# Patient Record
Sex: Female | Born: 1960 | Race: White | Hispanic: No
Health system: Midwestern US, Academic
[De-identification: ages and names within clinical notes are randomized; demographics above are authoritative.]

---

## 2018-05-27 ENCOUNTER — Encounter: Admit: 2018-05-27 | Discharge: 2018-05-27 | Payer: Commercial Managed Care - PPO

## 2018-06-27 ENCOUNTER — Encounter: Admit: 2018-06-27 | Discharge: 2018-06-27 | Payer: Commercial Managed Care - PPO

## 2018-06-28 ENCOUNTER — Encounter: Admit: 2018-06-28 | Discharge: 2018-06-28 | Payer: Commercial Managed Care - PPO

## 2018-07-19 ENCOUNTER — Encounter: Admit: 2018-07-19 | Discharge: 2018-07-19 | Payer: Commercial Managed Care - PPO

## 2018-08-18 ENCOUNTER — Encounter: Admit: 2018-08-18 | Discharge: 2018-08-18 | Payer: Commercial Managed Care - PPO

## 2018-10-13 ENCOUNTER — Encounter: Admit: 2018-10-13 | Discharge: 2018-10-13 | Payer: Commercial Managed Care - PPO

## 2018-10-18 ENCOUNTER — Encounter: Admit: 2018-10-18 | Discharge: 2018-10-18 | Payer: Commercial Managed Care - PPO

## 2018-10-26 ENCOUNTER — Encounter: Admit: 2018-10-26 | Discharge: 2018-10-26 | Payer: Commercial Managed Care - PPO

## 2019-01-20 ENCOUNTER — Encounter: Admit: 2019-01-20 | Discharge: 2019-01-20

## 2019-11-14 ENCOUNTER — Encounter: Admit: 2019-11-14 | Discharge: 2019-11-14 | Payer: MEDICAID

## 2019-11-15 ENCOUNTER — Encounter: Admit: 2019-11-15 | Discharge: 2019-11-15 | Payer: MEDICAID

## 2019-11-15 ENCOUNTER — Ambulatory Visit: Admit: 2019-11-15 | Discharge: 2019-11-15 | Payer: MEDICAID

## 2019-11-15 DIAGNOSIS — R42 Dizziness and giddiness: Secondary | ICD-10-CM

## 2019-11-15 DIAGNOSIS — M9902 Segmental and somatic dysfunction of thoracic region: Secondary | ICD-10-CM

## 2019-11-15 DIAGNOSIS — Z8679 Personal history of other diseases of the circulatory system: Secondary | ICD-10-CM

## 2019-11-15 DIAGNOSIS — D539 Nutritional anemia, unspecified: Secondary | ICD-10-CM

## 2019-11-15 DIAGNOSIS — M5416 Radiculopathy, lumbar region: Secondary | ICD-10-CM

## 2019-11-15 DIAGNOSIS — F419 Anxiety disorder, unspecified: Secondary | ICD-10-CM

## 2019-11-15 DIAGNOSIS — B1920 Unspecified viral hepatitis C without hepatic coma: Secondary | ICD-10-CM

## 2019-11-15 DIAGNOSIS — K769 Liver disease, unspecified: Secondary | ICD-10-CM

## 2019-11-15 MED ORDER — TRIAMCINOLONE ACETONIDE 40 MG/ML IJ SUSP
80 mg | Freq: Once | EPIDURAL | 0 refills | Status: CP
Start: 2019-11-15 — End: ?
  Administered 2019-11-15: 18:00:00 80 mg via EPIDURAL

## 2019-11-15 MED ORDER — IOPAMIDOL 41 % IT SOLN
2.5 mL | Freq: Once | EPIDURAL | 0 refills | Status: CP
Start: 2019-11-15 — End: ?
  Administered 2019-11-15: 18:00:00 2.5 mL via EPIDURAL

## 2019-11-15 NOTE — Procedures
Attending Surgeon: Lizbeth Bark, MD    Anesthesia: Local    Pre-Procedure Diagnosis:   1. Lumbar radiculopathy        Post-Procedure Diagnosis:   1. Lumbar radiculopathy        [facility] INJECT SNRB/TFESI LUMBAR/SACRAL  Procedure: transforaminal epidural    Laterality: right    Location: Lumbar/Sacral - L5-S1 and L4-5      Consent:   Consent obtained: verbal and written  Consent given by: patient  Risks discussed: bleeding, bruising, infection, nerve damage, no change or worsening in pain, allergic reaction and reaction to medication  Alternatives discussed: alternative treatment  Discussed with patient the purpose of the treatment/procedure, other ways of treating my condition, including no treatment/ procedure and the risks and benefits of the alternatives. Patient has decided to proceed with treatment/procedure.        Universal Protocol:  Relevant documents: relevant documents present and verified  Test results: test results available and properly labeled  Imaging studies: imaging studies available  Required items: required blood products, implants, devices, and special equipment not available  Site marked: the operative site was marked  Patient identity confirmed: Patient identify confirmed verbally with patient.        Time out: Immediately prior to procedure a time out was called to verify the correct patient, procedure, equipment, support staff and site/side marked as required      Procedures Details:   Indications: pain   Prep: chlorhexidine  Number of Joints: 2  Approach: paramedian  Guidance: fluoroscopy  Contrast: Procedure confirmed with contrast under live fluoroscopy.  Needle and Epidural Catheter: quincke  Needle size: 25 G  Patient tolerance: Patient tolerated the procedure well with no immediate complications. Pressure was applied, and hemostasis was accomplished.  Outcome: Pain improved  Comments: Indications:Patient presents with a diagnosis of radiculopathy. The patient's history and physical exam were reviewed. The risks, benefits and alternatives to the procedure were discussed, and all questions were answered to the patient's satisfaction. The patient agreed to proceed, and written informed consent was obtained.     Procedure in Detail: IV was started? No    The patient was brought into the procedure room and placed in the prone position on the fluoroscopy table. Standard monitors were placed, and vital signs were observed throughout the procedure. The area of the lumbar spine was prepped with Chloroprep and draped in a sterile manner. ?    The right L4-L5 vertebral bodies were identified with AP fluoroscopy. An oblique view to the right was obtained to better visualize the inferior junction of the pedicle and transverse process. The 6 o'clock position of the pedicle was marked and identified. The skin and subcutaneous tissues in the area were anesthetized with 1% lidocaine. A 25-gauge, 3.5 inch needle was directed toward the targeted point under fluoroscopy until bone was contacted. The needle was then walked inferiorly until the neural foramen was entered. A lateral fluoroscopic view was then used to place the needle tip at the 10 o'clock position of the foramen.     The right L5-S1 vertebral bodies were identified with AP fluoroscopy. An oblique view to the right was obtained to better visualize the inferior junction of the pedicle and transverse process. The 6 o'clock position of the pedicle was marked and identified. The skin and subcutaneous tissues in the area were anesthetized with 1% lidocaine. A 25-gauge, 3.5 inch needle was directed toward the targeted point under fluoroscopy until bone was contacted. The needle was then  walked inferiorly until the neural foramen was entered. A lateral fluoroscopic view was then used to place the needle tip at the 10 o'clock position of the foramen.     Negative aspiration was confirmed, and 1ml of contrast was injected at each level. Appropriate neurograms were observed under live AP fluoroscopy with no noted vascular or intrathecal uptake. Then, after negative aspiration, a solution consisting of 1 mL 1% lidocaine and ?80 (steroid) mg triamcinolone with 1.5 mL of solution easily injected at each level. The needles were removed with a 1% lidocaine flush. The patient's back was cleaned and a bandage was placed over the needle insertion points.    The same procedure was performed on the opposite side? No    Disposition: The patient tolerated the procedure well, and there were no apparent complications. Vital signs remained stable througtout the procedure. The patient was taken to the recovery area where discharge instructions for the procedure were given.     Estimated Blood Loss: minimal    Specimens: none    Complications: none          Estimated blood loss: none or minimal  Specimens: none  Patient tolerated the procedure well with no immediate complications. Pressure was applied, and hemostasis was accomplished.

## 2019-11-15 NOTE — Progress Notes

## 2019-11-15 NOTE — Patient Instructions
Procedure Completed Today: Lumbar Transforaminal Steroid Injection    Important information following your procedure today: You may drive today    1. Pain relief may not be immediate. It is possible you may even experience an increase in pain during the first 24-48 hours followed by a gradual decrease of your pain.  2. Though the procedure is generally safe and complications are rare, we do ask that you be aware of any of the following:   ? Any swelling, persistent redness, new bleeding, or drainage from the site of the injection.  ? You should not experience a severe headache.  ? You should not run a fever over 101? F.  ? New onset of sharp, severe back & or neck pain.  ? New onset of upper or lower extremity numbness or weakness.  ? New difficulty controlling bowel or bladder function after the injection.  ? New shortness of breath.    If any of these occur, please call to report this occurrence to a nurse at [phone]. If you are calling after 4:00 p.m., on weekends or holidays please call [phone] and ask to have the resident physician on call for the physician paged or go to your local emergency room.  3. You may experience soreness at the injection site. Ice can be applied at 20 minute intervals. Avoid application of direct heat, hot showers or hot tubs today.  4. Avoid strenuous activity today. You may resume your regular activities and exercise tomorrow.  5. Patients with diabetes may see an elevation in blood sugars for 7-10 days after the injection. It is important to pay close attention to your diet, check your blood sugars daily and report extreme elevations to the physician that treats your diabetes.  6. Patients taking a daily blood thinner can resume their regular dose this evening.  7. It is important that you take all medications ordered by your pain physician. Taking medication as ordered is an important part of your pain care plan. If you cannot continue the medication plan, please notify the physician.     Possible side effects to steroids that may occur:  ? Flushing or redness of the face  ? Irritability  ? Fluid retention  ? Change in women?s menses    The following medications were used: Lidocaine , Triamcinolone   and Contrast Dye

## 2019-11-15 NOTE — Progress Notes
[facility]  INTERVENTIONAL PAIN PROCEDURE HISTORY AND PHYSICAL    Chief Complaint   Patient presents with   ? Lower Back - Pain       HISTORY OF PRESENT ILLNESS:  Erin Sanchez is a 59 y.o. year old female who presents for injection.  Denies fevers, chills, or recent hospitalizations.  Patient denies currently taking blood thinning medications.        Medical History:   Diagnosis Date   ? Anxiety disorder    ? Dizziness    ? Hearing loss    ? Hepatitis C    ? History of hypertension    ? Liver disease     HEP C -SPLEEN   ? Somatic dysfunction of thoracic region    ? Unspecified deficiency anemia        Surgical History:   Procedure Laterality Date   ? HX ENDOSCOPY  2012   ? COLONOSCOPY  2012   ? ESOPHAGUS SURGERY         family history includes Cancer in her mother; Heart Attack in her mother; Other in her father; Stroke in her father.    Social History     Socioeconomic History   ? Marital status: Widowed     Spouse name: Not on file   ? Number of children: Not on file   ? Years of education: Not on file   ? Highest education level: Not on file   Occupational History     Employer: [employer]   Tobacco Use   ? Smoking status: Former Smoker     Types: Cigarettes     Quit date: 03/09/1998     Years since quitting: 21.7   ? Smokeless tobacco: Never Used   Substance and Sexual Activity   ? Alcohol use: Yes     Alcohol/week: 2.5 standard drinks     Types: 3 drink(s) per week     Comment: stopped Aug 2012   ? Drug use: No   ? Sexual activity: Not on file   Other Topics Concern   ? Not on file   Social History Narrative   ? Not on file       Allergies   Allergen Reactions   ? Codeine ITCHING   ? Ciprofloxacin UNKNOWN     Used for an eye infection, caused patient to be very ill.       Vitals:    11/15/19 1234   BP: (!) 138/95   BP Source: Arm, Right Upper   Patient Position: Sitting   Pulse: 108   Resp: 18   Temp: 36.4 ?C (97.5 ?F)   TempSrc: Oral   SpO2: 99%   Weight: 90.7 kg (200 lb)   Height: 167.6 cm (66) PainSc: Eight       REVIEW OF SYSTEMS: 10 point ROS obtained and negative except back pain      PHYSICAL EXAM:  General: 59 y.o. female appears stated age, in no acute distress  HEENT: Normocephalic, atraumatic  Neck: No thyroidmegaly  Cardiovascular: Well perfused  Pulmonary: Unlabored respirations  Extremities: No cyanosis, clubbing, or edema  Skin: No lesions seen on exposed skin  Psychiatric:  Appropriate mood and affect  Musculoskeletal: No atrophy.   Neurologic: Antigravity strength in all extremities. CN II -XII grossly intact.  Alert and oriented x 3.           IMPRESSION:    1. Lumbar radiculopathy         PLAN: Lumbar  Transforaminal Steroid Injection Right L4-5, L5-S1

## 2020-01-09 ENCOUNTER — Ambulatory Visit: Admit: 2020-01-09 | Discharge: 2020-01-09 | Payer: MEDICAID

## 2020-01-09 ENCOUNTER — Encounter: Admit: 2020-01-09 | Discharge: 2020-01-09 | Payer: MEDICAID

## 2020-01-09 DIAGNOSIS — M9902 Segmental and somatic dysfunction of thoracic region: Secondary | ICD-10-CM

## 2020-01-09 DIAGNOSIS — K769 Liver disease, unspecified: Secondary | ICD-10-CM

## 2020-01-09 DIAGNOSIS — F419 Anxiety disorder, unspecified: Secondary | ICD-10-CM

## 2020-01-09 DIAGNOSIS — R42 Dizziness and giddiness: Secondary | ICD-10-CM

## 2020-01-09 DIAGNOSIS — Z8679 Personal history of other diseases of the circulatory system: Secondary | ICD-10-CM

## 2020-01-09 DIAGNOSIS — M5137 Other intervertebral disc degeneration, lumbosacral region: Secondary | ICD-10-CM

## 2020-01-09 DIAGNOSIS — M5416 Radiculopathy, lumbar region: Secondary | ICD-10-CM

## 2020-01-09 DIAGNOSIS — D539 Nutritional anemia, unspecified: Secondary | ICD-10-CM

## 2020-01-09 DIAGNOSIS — B1920 Unspecified viral hepatitis C without hepatic coma: Secondary | ICD-10-CM

## 2020-01-09 MED ORDER — HYDROCODONE-ACETAMINOPHEN 5-325 MG PO TAB
1 | ORAL_TABLET | ORAL | 0 refills | 30.00000 days | Status: AC | PRN
Start: 2020-01-09 — End: ?

## 2020-01-09 NOTE — Progress Notes
[facility] CLINIC NOTE       SUBJECTIVE: 59 year old female presents in follow-up from last visit on 11/15/2019.  At that time we did a right-sided L4-5, transforaminal epidural steroid injection.  She report about 80% relief for for about 3 or 4 weeks until she bent over and she felt pain in her back and all the pain returned.  She reports the pain is midline low back radiating mostly down right leg to just past her right knee.  Seems to be a bit different than initial presentation pain is no longer going down into her foot or ankle.  She is continuing exercises but she is having difficulty doing activities of daily living including cleaning her kitchen, standing at the sink to do dishes, cooking.  She needs to sit down and rest frequently.         Review of Systems    Current Outpatient Medications:   ?  acetaminophen (TYLENOL) 500 mg tablet, Take 500 mg by mouth twice daily. Max of 4,000 mg of acetaminophen in 24 hours., Disp: , Rfl:   ?  ALPRAZolam (XANAX) 0.5 mg tablet, Take 1 mg by mouth at bedtime as needed., Disp: , Rfl:   ?  chlorthalidone (HYGROTON) 50 mg tablet, Take 50 mg by mouth., Disp: , Rfl:   ?  cyclobenzaprine (FLEXERIL) 5 mg tablet, Take 5 mg by mouth three times daily as needed for Muscle Cramps., Disp: , Rfl:   ?  dicyclomine (BENTYL) 10 mg capsule, Take 10 mg by mouth four times daily., Disp: , Rfl:   ?  gabapentin (NEURONTIN) 300 mg capsule, Take 300 mg by mouth every 8 hours., Disp: , Rfl:   ?  HYDROcodone/acetaminophen (NORCO) 5/325 mg tablet, Take one tablet by mouth every 6 hours as needed for Pain, Disp: 60 tablet, Rfl: 0  ?  ibuprofen (MOTRIN) 800 mg tablet, Take 800 mg by mouth twice daily. Take with food., Disp: , Rfl:   ?  meloxicam (MOBIC) 15 mg tablet, Take 15 mg by mouth daily., Disp: , Rfl:   ?  omeprazole DR (PRILOSEC) 40 mg capsule, Take 40 mg by mouth., Disp: , Rfl:   ?  ondansetron (ZOFRAN) 4 mg tablet, Take 4 mg by mouth every 8 hours as needed., Disp: , Rfl:   ?  other medication, 1 Dose daily. Pt. Believes it is called Zetima, an anti-depressant. Dose unknown, Disp: , Rfl:   ?  potassium chloride SR (K-DUR) 20 mEq tablet, Take 20 mEq by mouth., Disp: , Rfl:   ?  PV W-O CAL/FERROUS FUMARATE/FA (M-VIT PO), Take 1 Tab by mouth daily., Disp: , Rfl:   ?  triazolam (HALCION) 0.25 mg tablet, Take 0.25 mg by mouth twice daily., Disp: , Rfl:   Allergies   Allergen Reactions   ? Codeine ITCHING   ? Ciprofloxacin UNKNOWN     Used for an eye infection, caused patient to be very ill.     Physical Exam  Vitals:    01/09/20 1327   BP: (!) 159/94   BP Source: Arm, Left Upper   Patient Position: Sitting   Pulse: 108   SpO2: 97%   PainSc: Nine        Pain Score: Nine  There is no height or weight on file to calculate BMI.           IMPRESSION:  1. Lumbar radiculopathy    2. DDD (degenerative disc disease), lumbosacral  PLAN:    Scheduling a right-sided L2-3, 3 4 transforaminal epidural steroid injection as her pain is more in those distributions and that seems to be where the worst of her degenerative disc disease seen on her CT scan.  We will see how she does with this injection, if she does not get longer lasting relief we will consider surgical referral.  Will prescribe Norco 5/325 to help her with pain control until she gets to the injection.

## 2020-01-30 ENCOUNTER — Encounter: Admit: 2020-01-30 | Discharge: 2020-01-30 | Payer: MEDICAID

## 2020-01-30 DIAGNOSIS — M5416 Radiculopathy, lumbar region: Secondary | ICD-10-CM

## 2020-01-31 ENCOUNTER — Ambulatory Visit: Admit: 2020-01-31 | Discharge: 2020-01-31 | Payer: MEDICAID

## 2020-01-31 ENCOUNTER — Encounter: Admit: 2020-01-31 | Discharge: 2020-01-31 | Payer: MEDICAID

## 2020-01-31 DIAGNOSIS — M5416 Radiculopathy, lumbar region: Secondary | ICD-10-CM

## 2020-01-31 DIAGNOSIS — M5137 Other intervertebral disc degeneration, lumbosacral region: Secondary | ICD-10-CM

## 2020-01-31 MED ORDER — TRIAMCINOLONE ACETONIDE 40 MG/ML IJ SUSP
80 mg | Freq: Once | EPIDURAL | 0 refills | Status: CP
Start: 2020-01-31 — End: ?
  Administered 2020-01-31: 18:00:00 80 mg via EPIDURAL

## 2020-01-31 MED ORDER — IOPAMIDOL 41 % IT SOLN
2.5 mL | Freq: Once | EPIDURAL | 0 refills | Status: CP
Start: 2020-01-31 — End: ?
  Administered 2020-01-31: 18:00:00 2.5 mL via EPIDURAL

## 2020-01-31 NOTE — Procedures
Attending Surgeon: Lizbeth Bark, MD    Anesthesia: Local    Pre-Procedure Diagnosis:   1. Lumbar radiculopathy    2. DDD (degenerative disc disease), lumbosacral        Post-Procedure Diagnosis:   1. Lumbar radiculopathy    2. DDD (degenerative disc disease), lumbosacral        [facility] INJECT SNRB/TFESI LUMBAR/SACRAL  Procedure: transforaminal epidural    Laterality: right    Location: Lumbar/Sacral - L2-3 and L3-4      Consent:   Consent obtained: verbal and written  Consent given by: patient  Risks discussed: bleeding, bruising, infection, nerve damage, no change or worsening in pain, allergic reaction and reaction to medication  Alternatives discussed: alternative treatment  Discussed with patient the purpose of the treatment/procedure, other ways of treating my condition, including no treatment/ procedure and the risks and benefits of the alternatives. Patient has decided to proceed with treatment/procedure.        Universal Protocol:  Relevant documents: relevant documents present and verified  Test results: test results available and properly labeled  Imaging studies: imaging studies available  Required items: required blood products, implants, devices, and special equipment not available  Site marked: the operative site was marked  Patient identity confirmed: Patient identify confirmed verbally with patient.        Time out: Immediately prior to procedure a time out was called to verify the correct patient, procedure, equipment, support staff and site/side marked as required      Procedures Details:   Indications: pain   Prep: chlorhexidine  Number of Joints: 2  Approach: paramedian  Guidance: fluoroscopy  Contrast: Procedure confirmed with contrast under live fluoroscopy.  Needle and Epidural Catheter: quincke  Needle size: 25 G  Patient tolerance: Patient tolerated the procedure well with no immediate complications. Pressure was applied, and hemostasis was accomplished.  Outcome: Pain improved Comments: Indications:Patient presents with a diagnosis of radiculopathy. The patient's history and physical exam were reviewed. The risks, benefits and alternatives to the procedure were discussed, and all questions were answered to the patient's satisfaction. The patient agreed to proceed, and written informed consent was obtained.     Procedure in Detail: IV was started? No    The patient was brought into the procedure room and placed in the prone position on the fluoroscopy table. Standard monitors were placed, and vital signs were observed throughout the procedure. The area of the lumbar spine was prepped with Chloroprep and draped in a sterile manner. ?    The right L2-L3  vertebral bodies were identified with AP fluoroscopy. An oblique view to the right was obtained to better visualize the inferior junction of the pedicle and transverse process. The 6 o'clock position of the pedicle was marked and identified. The skin and subcutaneous tissues in the area were anesthetized with 1% lidocaine. A 25-gauge, 3.5 inch needle was directed toward the targeted point under fluoroscopy until bone was contacted. The needle was then walked inferiorly until the neural foramen was entered. A lateral fluoroscopic view was then used to place the needle tip at the 10 o'clock position of the foramen.     The L3-L4 vertebral bodies were identified with AP fluoroscopy. An oblique view to the right was obtained to better visualize the inferior junction of the pedicle and transverse process. The 6 o'clock position of the pedicle was marked and identified. The skin and subcutaneous tissues in the area were anesthetized with 1% lidocaine. A 25-gauge, 3.5 inch  needle was directed toward the targeted point under fluoroscopy until bone was contacted. The needle was then walked inferiorly until the neural foramen was entered. A lateral fluoroscopic view was then used to place the needle tip at the 10 o'clock position of the foramen. Negative aspiration was confirmed, and 1ml of contrast was injected at each level. Appropriate neurograms were observed under live AP fluoroscopy with no noted vascular or intrathecal uptake. Then, after negative aspiration, a solution consisting of 1 mL 1% lidocaine and ?80 (steroid) mg triamcinolone with 1.5 mL of solution easily injected at each level. The needles were removed with a 1% lidocaine flush. The patient's back was cleaned and a bandage was placed over the needle insertion points.    The same procedure was performed on the opposite side? No    Disposition: The patient tolerated the procedure well, and there were no apparent complications. Vital signs remained stable througtout the procedure. The patient was taken to the recovery area where discharge instructions for the procedure were given.     Estimated Blood Loss: minimal    Specimens: none    Complications: none          Estimated blood loss: none or minimal  Specimens: none  Patient tolerated the procedure well with no immediate complications. Pressure was applied, and hemostasis was accomplished.

## 2020-01-31 NOTE — Progress Notes
[facility]  INTERVENTIONAL PAIN PROCEDURE HISTORY AND PHYSICAL    Chief Complaint   Patient presents with   ? Lower Back - Pain       HISTORY OF PRESENT ILLNESS:  Erin Sanchez is a 59 y.o. year old female who presents for injection.  Denies fevers, chills, or recent hospitalizations.  Patient denies currently taking blood thinning medications.        Medical History:   Diagnosis Date   ? Anxiety disorder    ? Dizziness    ? Hearing loss    ? Hepatitis C    ? History of hypertension    ? Liver disease     HEP C -SPLEEN   ? Somatic dysfunction of thoracic region    ? Unspecified deficiency anemia        Surgical History:   Procedure Laterality Date   ? HX ENDOSCOPY  2012   ? COLONOSCOPY  2012   ? ESOPHAGUS SURGERY         family history includes Cancer in her mother; Heart Attack in her mother; Other in her father; Stroke in her father.    Social History     Socioeconomic History   ? Marital status: Widowed     Spouse name: Not on file   ? Number of children: Not on file   ? Years of education: Not on file   ? Highest education level: Not on file   Occupational History     Employer: [employer]   Tobacco Use   ? Smoking status: Former Smoker     Types: Cigarettes     Quit date: 03/09/1998     Years since quitting: 21.9   ? Smokeless tobacco: Never Used   Substance and Sexual Activity   ? Alcohol use: Yes     Alcohol/week: 2.5 standard drinks     Types: 3 drink(s) per week     Comment: stopped Aug 2012   ? Drug use: No   ? Sexual activity: Not on file   Other Topics Concern   ? Not on file   Social History Narrative   ? Not on file       Allergies   Allergen Reactions   ? Codeine ITCHING   ? Ciprofloxacin UNKNOWN     Used for an eye infection, caused patient to be very ill.       Vitals:    01/31/20 1219   BP: (!) 148/89   BP Source: Arm, Right Upper   Patient Position: Sitting   Pulse: 114   Resp: 16   Temp: 36.4 ?C (97.5 ?F)   TempSrc: Oral   SpO2: 97%   Weight: 90.7 kg (200 lb)   Height: 167.6 cm (66) PainSc: Five       REVIEW OF SYSTEMS: 10 point ROS obtained and negative except back pain      PHYSICAL EXAM:  General: 59 y.o. female appears stated age, in no acute distress  HEENT: Normocephalic, atraumatic  Neck: No thyroidmegaly  Cardiovascular: Well perfused  Pulmonary: Unlabored respirations  Extremities: No cyanosis, clubbing, or edema  Skin: No lesions seen on exposed skin  Psychiatric:  Appropriate mood and affect  Musculoskeletal: No atrophy.   Neurologic: Antigravity strength in all extremities. CN II -XII grossly intact.  Alert and oriented x 3.           IMPRESSION:    1. Lumbar radiculopathy    2. DDD (degenerative disc disease), lumbosacral  PLAN: Lumbar Transforaminal Steroid Injection Right L2-L3, L3-L4

## 2020-01-31 NOTE — Patient Instructions
Procedure Completed Today: Lumbar Transforaminal Steroid Injection    Important information following your procedure today: You may drive today    1. Pain relief may not be immediate. It is possible you may even experience an increase in pain during the first 24-48 hours followed by a gradual decrease of your pain.  2. Though the procedure is generally safe and complications are rare, we do ask that you be aware of any of the following:   ? Any swelling, persistent redness, new bleeding, or drainage from the site of the injection.  ? You should not experience a severe headache.  ? You should not run a fever over 101? F.  ? New onset of sharp, severe back & or neck pain.  ? New onset of upper or lower extremity numbness or weakness.  ? New difficulty controlling bowel or bladder function after the injection.  ? New shortness of breath.    If any of these occur, please call to report this occurrence to a nurse at [phone]. If you are calling after 4:00 p.m., on weekends or holidays please call [phone] and ask to have the resident physician on call for the physician paged or go to your local emergency room.  3. You may experience soreness at the injection site. Ice can be applied at 20 minute intervals. Avoid application of direct heat, hot showers or hot tubs today.  4. Avoid strenuous activity today. You may resume your regular activities and exercise tomorrow.  5. Patients with diabetes may see an elevation in blood sugars for 7-10 days after the injection. It is important to pay close attention to your diet, check your blood sugars daily and report extreme elevations to the physician that treats your diabetes.  6. Patients taking a daily blood thinner can resume their regular dose this evening.  7. It is important that you take all medications ordered by your pain physician. Taking medication as ordered is an important part of your pain care plan. If you cannot continue the medication plan, please notify the physician.     Possible side effects to steroids that may occur:  ? Flushing or redness of the face  ? Irritability  ? Fluid retention  ? Change in women?s menses    The following medications were used: Lidocaine , Triamcinolone   and Contrast Dye

## 2020-01-31 NOTE — Progress Notes

## 2020-02-11 ENCOUNTER — Encounter: Admit: 2020-02-11 | Discharge: 2020-02-11 | Payer: MEDICAID

## 2020-02-12 ENCOUNTER — Encounter: Admit: 2020-02-12 | Discharge: 2020-02-12 | Payer: MEDICAID

## 2020-02-12 ENCOUNTER — Ambulatory Visit: Admit: 2020-02-12 | Discharge: 2020-02-12 | Payer: MEDICAID

## 2020-02-12 DIAGNOSIS — K449 Diaphragmatic hernia without obstruction or gangrene: Secondary | ICD-10-CM

## 2020-02-12 DIAGNOSIS — K279 Peptic ulcer, site unspecified, unspecified as acute or chronic, without hemorrhage or perforation: Secondary | ICD-10-CM

## 2020-02-12 DIAGNOSIS — F419 Anxiety disorder, unspecified: Secondary | ICD-10-CM

## 2020-02-12 DIAGNOSIS — D539 Nutritional anemia, unspecified: Secondary | ICD-10-CM

## 2020-02-12 DIAGNOSIS — K7460 Unspecified cirrhosis of liver: Secondary | ICD-10-CM

## 2020-02-12 DIAGNOSIS — Z1211 Encounter for screening for malignant neoplasm of colon: Secondary | ICD-10-CM

## 2020-02-12 DIAGNOSIS — D509 Iron deficiency anemia, unspecified: Secondary | ICD-10-CM

## 2020-02-12 DIAGNOSIS — Z20822 Encounter for screening laboratory testing for COVID-19 virus in asymptomatic patient: Secondary | ICD-10-CM

## 2020-02-12 DIAGNOSIS — K769 Liver disease, unspecified: Secondary | ICD-10-CM

## 2020-02-12 DIAGNOSIS — Z8679 Personal history of other diseases of the circulatory system: Secondary | ICD-10-CM

## 2020-02-12 DIAGNOSIS — R42 Dizziness and giddiness: Secondary | ICD-10-CM

## 2020-02-12 DIAGNOSIS — B1920 Unspecified viral hepatitis C without hepatic coma: Secondary | ICD-10-CM

## 2020-02-12 DIAGNOSIS — M9902 Segmental and somatic dysfunction of thoracic region: Secondary | ICD-10-CM

## 2020-02-12 LAB — CBC AND DIFF
Lab: 0.1 10*3/uL (ref 0–0.20)
Lab: 0.1 10*3/uL (ref 0–0.45)
Lab: 0.6 10*3/uL (ref 0–0.80)
Lab: 1 % (ref 0–2)
Lab: 1 % (ref 0–5)
Lab: 11 K/UL — ABNORMAL HIGH (ref 4.5–11.0)
Lab: 18 % — ABNORMAL HIGH (ref 11–15)
Lab: 2.4 10*3/uL (ref 1.0–4.8)
Lab: 21 % — ABNORMAL LOW (ref 24–44)
Lab: 281 10*3/uL (ref 150–400)
Lab: 5 % (ref 4–12)
Lab: 5 M/UL — ABNORMAL HIGH (ref 4.0–5.0)
Lab: 72 % (ref 41–77)
Lab: 8.3 10*3/uL — ABNORMAL HIGH (ref 1.8–7.0)
Lab: 8.8 FL (ref 7–11)

## 2020-02-12 LAB — IRON + BINDING CAPACITY + %SAT+ FERRITIN
Lab: 39 ug/dL — ABNORMAL LOW (ref 50–160)
Lab: 697 ug/dL — ABNORMAL HIGH (ref 270–380)
Lab: 9 ng/mL — ABNORMAL LOW (ref 10–200)

## 2020-02-12 LAB — HEPATITIS C ANTIBODY W REFLEX HCV PCR QUANT

## 2020-02-12 LAB — PROTIME INR (PT): Lab: 1 % — ABNORMAL LOW (ref 0.8–1.2)

## 2020-02-12 MED ORDER — OMEPRAZOLE 40 MG PO CPDR
40 mg | ORAL_CAPSULE | Freq: Two times a day (BID) | ORAL | 1 refills | Status: AC
Start: 2020-02-12 — End: ?

## 2020-02-12 MED ORDER — PEG-ELECTROLYTE SOLN 420 GRAM PO SOLR
0 refills | Status: AC
Start: 2020-02-12 — End: ?

## 2020-02-12 NOTE — Telephone Encounter
Referral received via internal. Docs in 02.

## 2020-02-12 NOTE — Telephone Encounter
I returned patient's phone call.  Patient had called requesting for instructions prior to endoscopy scheduled with Dr. Allena Katz on 02/12/2020.    Patient states that she is unaware of the type of endoscopic work-up she is scheduled for.  She states that she previously followed with [facility] and has been waiting for more than 12 months to undergo endoscopy and reports a prior history of hiatal hernia.    I requested patient to undergo rapid testing for COVID-19 as a preprocedural measurement.  I requested the patient to take copy of results if negative to present at the time of endoscopy.    Patient verbalized understanding and agreed to the plan.    This is a late entry.    Tytan Sandate  GI fellow   02/11/2020 8:03 PM

## 2020-02-13 ENCOUNTER — Encounter: Admit: 2020-02-13 | Discharge: 2020-02-13 | Payer: MEDICAID

## 2020-02-13 ENCOUNTER — Ambulatory Visit: Admit: 2020-02-13 | Discharge: 2020-02-13 | Payer: MEDICAID

## 2020-02-13 DIAGNOSIS — K7460 Unspecified cirrhosis of liver: Secondary | ICD-10-CM

## 2020-02-13 DIAGNOSIS — Z1211 Encounter for screening for malignant neoplasm of colon: Secondary | ICD-10-CM

## 2020-02-13 DIAGNOSIS — D509 Iron deficiency anemia, unspecified: Secondary | ICD-10-CM

## 2020-02-13 DIAGNOSIS — K449 Diaphragmatic hernia without obstruction or gangrene: Secondary | ICD-10-CM

## 2020-02-14 ENCOUNTER — Encounter: Admit: 2020-02-14 | Discharge: 2020-02-14 | Payer: MEDICAID

## 2020-02-15 ENCOUNTER — Encounter: Admit: 2020-02-15 | Discharge: 2020-02-15 | Payer: MEDICAID

## 2020-02-15 ENCOUNTER — Ambulatory Visit: Admit: 2020-02-15 | Discharge: 2020-02-15 | Payer: MEDICAID

## 2020-02-15 DIAGNOSIS — K7460 Unspecified cirrhosis of liver: Secondary | ICD-10-CM

## 2020-03-25 ENCOUNTER — Encounter: Admit: 2020-03-25 | Discharge: 2020-03-25 | Payer: MEDICAID

## 2020-03-25 DIAGNOSIS — D539 Nutritional anemia, unspecified: Secondary | ICD-10-CM

## 2020-03-25 DIAGNOSIS — Z8679 Personal history of other diseases of the circulatory system: Secondary | ICD-10-CM

## 2020-03-25 DIAGNOSIS — B1920 Unspecified viral hepatitis C without hepatic coma: Secondary | ICD-10-CM

## 2020-03-25 DIAGNOSIS — R42 Dizziness and giddiness: Secondary | ICD-10-CM

## 2020-03-25 DIAGNOSIS — F419 Anxiety disorder, unspecified: Secondary | ICD-10-CM

## 2020-03-25 DIAGNOSIS — M9902 Segmental and somatic dysfunction of thoracic region: Secondary | ICD-10-CM

## 2020-03-25 DIAGNOSIS — K769 Liver disease, unspecified: Secondary | ICD-10-CM

## 2020-03-26 ENCOUNTER — Encounter: Admit: 2020-03-26 | Discharge: 2020-03-26 | Payer: MEDICAID

## 2020-03-26 DIAGNOSIS — K7460 Unspecified cirrhosis of liver: Secondary | ICD-10-CM

## 2020-03-26 DIAGNOSIS — D539 Nutritional anemia, unspecified: Secondary | ICD-10-CM

## 2020-03-26 DIAGNOSIS — K769 Liver disease, unspecified: Secondary | ICD-10-CM

## 2020-03-26 DIAGNOSIS — M9902 Segmental and somatic dysfunction of thoracic region: Secondary | ICD-10-CM

## 2020-03-26 DIAGNOSIS — F419 Anxiety disorder, unspecified: Secondary | ICD-10-CM

## 2020-03-26 DIAGNOSIS — R42 Dizziness and giddiness: Secondary | ICD-10-CM

## 2020-03-26 DIAGNOSIS — B1920 Unspecified viral hepatitis C without hepatic coma: Secondary | ICD-10-CM

## 2020-03-26 DIAGNOSIS — Z8679 Personal history of other diseases of the circulatory system: Secondary | ICD-10-CM

## 2020-03-30 ENCOUNTER — Encounter: Admit: 2020-03-30 | Discharge: 2020-03-30 | Payer: MEDICAID

## 2020-03-30 ENCOUNTER — Encounter: Admit: 2020-03-30 | Discharge: 2020-03-31 | Payer: MEDICAID

## 2020-03-31 DIAGNOSIS — Z20822 Contact with and (suspected) exposure to covid-19: Secondary | ICD-10-CM

## 2020-04-01 ENCOUNTER — Encounter: Admit: 2020-04-01 | Discharge: 2020-04-01 | Payer: MEDICAID

## 2020-04-02 ENCOUNTER — Encounter: Admit: 2020-04-02 | Discharge: 2020-04-02 | Payer: MEDICAID

## 2020-04-02 ENCOUNTER — Encounter: Admit: 2020-02-12 | Discharge: 2020-02-12 | Payer: MEDICAID

## 2020-04-02 DIAGNOSIS — M9902 Segmental and somatic dysfunction of thoracic region: Secondary | ICD-10-CM

## 2020-04-02 DIAGNOSIS — D539 Nutritional anemia, unspecified: Secondary | ICD-10-CM

## 2020-04-02 DIAGNOSIS — Z8679 Personal history of other diseases of the circulatory system: Secondary | ICD-10-CM

## 2020-04-02 DIAGNOSIS — F419 Anxiety disorder, unspecified: Secondary | ICD-10-CM

## 2020-04-02 DIAGNOSIS — K7460 Unspecified cirrhosis of liver: Secondary | ICD-10-CM

## 2020-04-02 DIAGNOSIS — K769 Liver disease, unspecified: Secondary | ICD-10-CM

## 2020-04-02 DIAGNOSIS — R42 Dizziness and giddiness: Secondary | ICD-10-CM

## 2020-04-02 DIAGNOSIS — B1920 Unspecified viral hepatitis C without hepatic coma: Secondary | ICD-10-CM

## 2020-04-02 DIAGNOSIS — E119 Type 2 diabetes mellitus without complications: Secondary | ICD-10-CM

## 2020-04-02 MED ORDER — LIDOCAINE (PF) 100 MG/5 ML (2 %) IV SYRG
INTRAVENOUS | 0 refills | Status: DC
Start: 2020-04-02 — End: 2020-04-02

## 2020-04-02 MED ORDER — GLYCOPYRROLATE 0.2 MG/ML IJ SOLN
INTRAVENOUS | 0 refills | Status: DC
Start: 2020-04-02 — End: 2020-04-02

## 2020-04-02 MED ORDER — PROPOFOL 10 MG/ML IV EMUL 20 ML (INFUSION)(AM)(OR)
INTRAVENOUS | 0 refills | Status: DC
Start: 2020-04-02 — End: 2020-04-02

## 2020-04-02 MED ORDER — PROPOFOL INJ 10 MG/ML IV VIAL
INTRAVENOUS | 0 refills | Status: DC
Start: 2020-04-02 — End: 2020-04-02

## 2020-04-02 MED ORDER — ONDANSETRON HCL (PF) 4 MG/2 ML IJ SOLN
INTRAVENOUS | 0 refills | Status: DC
Start: 2020-04-02 — End: 2020-04-02

## 2020-04-02 MED ORDER — EPHEDRINE SULFATE 50 MG/ML IV SOLN
INTRAVENOUS | 0 refills | Status: DC
Start: 2020-04-02 — End: 2020-04-02

## 2020-04-03 ENCOUNTER — Encounter: Admit: 2020-04-03 | Discharge: 2020-04-03 | Payer: MEDICAID

## 2020-04-03 DIAGNOSIS — R42 Dizziness and giddiness: Secondary | ICD-10-CM

## 2020-04-03 DIAGNOSIS — F419 Anxiety disorder, unspecified: Secondary | ICD-10-CM

## 2020-04-03 DIAGNOSIS — K769 Liver disease, unspecified: Secondary | ICD-10-CM

## 2020-04-03 DIAGNOSIS — K7460 Unspecified cirrhosis of liver: Secondary | ICD-10-CM

## 2020-04-03 DIAGNOSIS — B1920 Unspecified viral hepatitis C without hepatic coma: Secondary | ICD-10-CM

## 2020-04-03 DIAGNOSIS — M9902 Segmental and somatic dysfunction of thoracic region: Secondary | ICD-10-CM

## 2020-04-03 DIAGNOSIS — Z8679 Personal history of other diseases of the circulatory system: Secondary | ICD-10-CM

## 2020-04-03 DIAGNOSIS — E119 Type 2 diabetes mellitus without complications: Secondary | ICD-10-CM

## 2020-04-03 DIAGNOSIS — D539 Nutritional anemia, unspecified: Secondary | ICD-10-CM

## 2020-04-03 NOTE — Telephone Encounter
VM from pt with questions about endoscopy procedures yesterday 10/12.     Call returned to pt .Pt anted to clarify results of EGD so far just to make sure she heard correctly after her procedure yesterday. Pt plans to follow up with hepatology. Discussed possible referral to cardiothoracic surgeon but are waiting for biopsy results.   Pt also wanted to update her PCP on file.   Pt verbalized understanding with no further questions or concerns at this time.

## 2020-04-09 ENCOUNTER — Encounter: Admit: 2020-04-09 | Discharge: 2020-04-09 | Payer: MEDICAID

## 2020-04-09 ENCOUNTER — Ambulatory Visit: Admit: 2020-04-09 | Discharge: 2020-04-09 | Payer: MEDICAID

## 2020-04-09 DIAGNOSIS — K769 Liver disease, unspecified: Secondary | ICD-10-CM

## 2020-04-09 DIAGNOSIS — E119 Type 2 diabetes mellitus without complications: Secondary | ICD-10-CM

## 2020-04-09 DIAGNOSIS — F419 Anxiety disorder, unspecified: Secondary | ICD-10-CM

## 2020-04-09 DIAGNOSIS — M5416 Radiculopathy, lumbar region: Secondary | ICD-10-CM

## 2020-04-09 DIAGNOSIS — B1920 Unspecified viral hepatitis C without hepatic coma: Secondary | ICD-10-CM

## 2020-04-09 DIAGNOSIS — D539 Nutritional anemia, unspecified: Secondary | ICD-10-CM

## 2020-04-09 DIAGNOSIS — M461 Sacroiliitis, not elsewhere classified: Secondary | ICD-10-CM

## 2020-04-09 DIAGNOSIS — R42 Dizziness and giddiness: Secondary | ICD-10-CM

## 2020-04-09 DIAGNOSIS — Z8679 Personal history of other diseases of the circulatory system: Secondary | ICD-10-CM

## 2020-04-09 DIAGNOSIS — K7460 Unspecified cirrhosis of liver: Secondary | ICD-10-CM

## 2020-04-09 DIAGNOSIS — M9902 Segmental and somatic dysfunction of thoracic region: Secondary | ICD-10-CM

## 2020-04-09 NOTE — Progress Notes
SPINE CENTER CLINIC NOTE       SUBJECTIVE: 59 year old female presenting in follow-up from last visit on 01/31/2020. At that time we did a right-sided L2-3, three four epidural steroid injection. She reports getting relief for a few months but the pain is returned. She reports the pain is in the right side of her low back going down her right leg to her right foot. She reports sharp pain in the buttock area that is brought on by sitting too long in one position. Worse when she gets up and moves around. There is a different pain going down her right leg that goes all the way down to her right foot that is worse when she stands and walks too long. Unfortunately she has had to have a shoulder replacement and has discovered a large hiatal hernia since our last visit. She is looking for pain relief so that she can continue to heal up her other ailments before considering any lumbar spine surgery.         Review of Systems    Current Outpatient Medications:   ?  acetaminophen (TYLENOL) 500 mg tablet, Take 500 mg by mouth twice daily. Max of 4,000 mg of acetaminophen in 24 hours., Disp: , Rfl:   ?  chlorthalidone (HYGROTON) 50 mg tablet, Take 50 mg by mouth., Disp: , Rfl:   ?  HYDROcodone/acetaminophen (NORCO) 5/325 mg tablet, Take one tablet by mouth every 6 hours as needed for Pain, Disp: 60 tablet, Rfl: 0  ?  ibuprofen (MOTRIN) 800 mg tablet, Take 800 mg by mouth twice daily. Take with food., Disp: , Rfl:   ?  metformin HCl (METFORMIN PO), Take  by mouth., Disp: , Rfl:   ?  omeprazole DR (PRILOSEC) 40 mg capsule, Take one capsule by mouth twice daily before meals., Disp: 180 capsule, Rfl: 1  ?  ondansetron (ZOFRAN) 4 mg tablet, Take 4 mg by mouth every 8 hours as needed., Disp: , Rfl:   ?  other medication, 1 Dose daily. Pt. Believes it is called Zetima, an anti-depressant. Dose unknown, Disp: , Rfl:   ?  peg-electrolyte solution (NULYTELY) 420 gram oral solution, Split dose by mouth as directed by GI office, Disp: 4000 mL, Rfl: 0  ?  triazolam (HALCION) 0.25 mg tablet, Take 0.25 mg by mouth twice daily., Disp: , Rfl:   Allergies   Allergen Reactions   ? Codeine ITCHING   ? Ciprofloxacin UNKNOWN     Used for an eye infection, caused patient to be very ill.     Physical Exam  Vitals:    04/09/20 1407   BP: 133/85   BP Source: Arm, Left Upper   Patient Position: Sitting   Pulse: 120   Resp: 16   Temp: 36.7 ?C (98.1 ?F)   TempSrc: Oral   SpO2: 98%   Weight: 90.7 kg (200 lb)   Height: 170.2 cm (67)   PainSc: Eight        Pain Score: Eight  Body mass index is 31.32 kg/m?Marland Kitchen  Physical Exam ?  Gen: comfortable, NAD ?  HEENT: NCAT, anicteric sclera ?  Card: Extremities warm, well-perfused, cap refill <2sec ?  Pulm: no distress, not cyanotic ?  Abd: soft, non-distended ?  Skin: Skin is warm and dry.  Psychiatric: normal mood and affect. Behavior is normal.     Neuro ?  CNII-XII grossly normal ?  Mental status appropriate     MSK: ?  Inspection: grossly symmetric, no obvious  deformity, no erythema ?  Palpation: Tender palpation right SI joint  Maneuvers: Patrick's, Gaenslen's, SI compression all positive on the right             IMPRESSION:  1. Sacroiliitis (HCC)    2. Lumbar radiculopathy          PLAN: Plan is to schedule a right-sided L5-S1 transforaminal epidural steroid injection and a right sacroiliac joint injection to be done at the same time.

## 2020-04-09 NOTE — Telephone Encounter
VM from pt reporting continued epigastric pain since endoscopies as well as nausea.     On review of chart biopsies are resulted but not reviewed by physician at this time.     Call returned to pt. No answer. Left detailed VM that call will be routed to Dr. Allena Katz asking for next steps in plan of care.     Routing to Dr. Allena Katz for recommendations.

## 2020-04-15 ENCOUNTER — Encounter: Admit: 2020-04-15 | Discharge: 2020-04-15 | Payer: MEDICAID

## 2020-04-15 MED ORDER — PROMETHAZINE 25 MG PO TAB
25 mg | ORAL_TABLET | ORAL | 0 refills | 7.00000 days | Status: AC
Start: 2020-04-15 — End: ?

## 2020-04-22 ENCOUNTER — Encounter: Admit: 2020-04-22 | Discharge: 2020-04-22 | Payer: MEDICAID

## 2020-04-22 ENCOUNTER — Ambulatory Visit: Admit: 2020-04-22 | Discharge: 2020-04-22 | Payer: MEDICAID

## 2020-04-22 DIAGNOSIS — K7460 Unspecified cirrhosis of liver: Secondary | ICD-10-CM

## 2020-04-22 DIAGNOSIS — D509 Iron deficiency anemia, unspecified: Secondary | ICD-10-CM

## 2020-04-22 DIAGNOSIS — K769 Liver disease, unspecified: Secondary | ICD-10-CM

## 2020-04-22 DIAGNOSIS — K7469 Other cirrhosis of liver: Secondary | ICD-10-CM

## 2020-04-22 DIAGNOSIS — D539 Nutritional anemia, unspecified: Secondary | ICD-10-CM

## 2020-04-22 DIAGNOSIS — B1920 Unspecified viral hepatitis C without hepatic coma: Secondary | ICD-10-CM

## 2020-04-22 DIAGNOSIS — F419 Anxiety disorder, unspecified: Secondary | ICD-10-CM

## 2020-04-22 DIAGNOSIS — M9902 Segmental and somatic dysfunction of thoracic region: Secondary | ICD-10-CM

## 2020-04-22 DIAGNOSIS — Z8679 Personal history of other diseases of the circulatory system: Secondary | ICD-10-CM

## 2020-04-22 DIAGNOSIS — R42 Dizziness and giddiness: Secondary | ICD-10-CM

## 2020-04-22 DIAGNOSIS — E119 Type 2 diabetes mellitus without complications: Secondary | ICD-10-CM

## 2020-04-22 LAB — 25-OH VITAMIN D (D2 + D3): Lab: 28 ng/mL — ABNORMAL LOW (ref 30–80)

## 2020-04-22 LAB — IRON + BINDING CAPACITY + %SAT+ FERRITIN
Lab: 18 ug/dL — ABNORMAL LOW (ref 50–160)
Lab: 3 % — ABNORMAL LOW (ref 28–42)
Lab: 644 ug/dL — ABNORMAL HIGH (ref 270–380)
Lab: 7 ng/mL — ABNORMAL LOW (ref 10–200)

## 2020-04-22 LAB — COMPREHENSIVE METABOLIC PANEL
Lab: 0.5 mg/dL (ref 0.3–1.2)
Lab: 11 K/UL (ref 3–12)
Lab: 143 MMOL/L — ABNORMAL LOW (ref 137–147)
Lab: 16 U/L (ref 7–40)
Lab: 18 U/L (ref 7–56)
Lab: 24 MMOL/L (ref 21–30)
Lab: 4 MMOL/L — ABNORMAL LOW (ref 3.5–5.1)
Lab: 4.1 g/dL — ABNORMAL LOW (ref 3.5–5.0)
Lab: 7 g/dL (ref 6.0–8.0)
Lab: 8.6 mg/dL (ref 8.5–10.6)
Lab: 87 U/L (ref 25–110)
Lab: >60 mL/min (ref >60)

## 2020-04-22 LAB — CBC AND DIFF
Lab: 0 10*3/uL (ref 0–0.20)
Lab: 4.6 M/UL (ref 4.0–5.0)
Lab: 6.1 10*3/uL (ref 4.5–11.0)

## 2020-04-22 LAB — PROTIME INR (PT): Lab: 1 (ref 0.8–1.2)

## 2020-04-22 LAB — ALPHA FETO PROTEIN (AFP): Lab: 3.8 ng/mL (ref 0.0–15.0)

## 2020-04-23 ENCOUNTER — Encounter: Admit: 2020-04-23 | Discharge: 2020-04-23 | Payer: MEDICAID

## 2020-04-24 ENCOUNTER — Encounter: Admit: 2020-04-24 | Discharge: 2020-04-24 | Payer: MEDICAID

## 2020-04-24 DIAGNOSIS — E559 Vitamin D deficiency, unspecified: Secondary | ICD-10-CM

## 2020-04-24 MED ORDER — ERGOCALCIFEROL (VITAMIN D2) 1,250 MCG (50,000 UNIT) PO CAP
1 | ORAL_CAPSULE | ORAL | 0 refills | 56.00000 days | Status: AC
Start: 2020-04-24 — End: ?

## 2020-04-25 ENCOUNTER — Encounter: Admit: 2020-04-25 | Discharge: 2020-04-25 | Payer: MEDICAID

## 2020-04-25 DIAGNOSIS — R790 Abnormal level of blood mineral: Secondary | ICD-10-CM

## 2020-04-25 MED ORDER — DIPHENHYDRAMINE HCL 25 MG PO CAP
25 mg | Freq: Once | ORAL | 0 refills
Start: 2020-04-25 — End: ?

## 2020-04-25 MED ORDER — IRON DEXTRAN IVPB
975 mg | Freq: Once | INTRAVENOUS | 0 refills
Start: 2020-04-25 — End: ?

## 2020-04-25 MED ORDER — ACETAMINOPHEN 500 MG PO TAB
500 mg | Freq: Once | ORAL | 0 refills
Start: 2020-04-25 — End: ?

## 2020-04-25 MED ORDER — IRON DEXTRAN IVPB
25 mg | Freq: Once | INTRAVENOUS | 0 refills
Start: 2020-04-25 — End: ?

## 2020-05-02 ENCOUNTER — Encounter

## 2020-05-02 DIAGNOSIS — K449 Diaphragmatic hernia without obstruction or gangrene: Secondary | ICD-10-CM

## 2020-05-14 ENCOUNTER — Encounter: Admit: 2020-05-14 | Discharge: 2020-05-14 | Payer: MEDICAID

## 2020-05-14 DIAGNOSIS — M5416 Radiculopathy, lumbar region: Secondary | ICD-10-CM

## 2020-05-14 NOTE — Telephone Encounter
Replied to patient via MyChart message.

## 2020-05-14 NOTE — Telephone Encounter
Please call Poetry back regarding iron infusion.  She has never heard from anyone to get it scheduled.  Or she said you could My Chart her.

## 2020-05-15 ENCOUNTER — Encounter: Admit: 2020-05-15 | Discharge: 2020-05-15 | Payer: MEDICAID

## 2020-05-15 ENCOUNTER — Ambulatory Visit: Admit: 2020-05-15 | Discharge: 2020-05-15 | Payer: MEDICAID

## 2020-05-15 DIAGNOSIS — M5416 Radiculopathy, lumbar region: Secondary | ICD-10-CM

## 2020-05-15 DIAGNOSIS — M461 Sacroiliitis, not elsewhere classified: Secondary | ICD-10-CM

## 2020-05-15 MED ORDER — TRIAMCINOLONE ACETONIDE 40 MG/ML IJ SUSP
40 mg | Freq: Once | EPIDURAL | 0 refills | Status: CP
Start: 2020-05-15 — End: ?

## 2020-05-15 MED ORDER — BUPIVACAINE (PF) 0.5 % (5 MG/ML) IJ SOLN
5 mL | Freq: Once | INTRAMUSCULAR | 0 refills | Status: CP
Start: 2020-05-15 — End: ?

## 2020-05-15 MED ORDER — DEXAMETHASONE SODIUM PHOS (PF) 10 MG/ML IJ SOLN
10 mg | Freq: Once | 0 refills | Status: CP
Start: 2020-05-15 — End: ?

## 2020-05-15 MED ORDER — IOHEXOL 240 MG IODINE/ML IV SOLN
2.5 mL | Freq: Once | EPIDURAL | 0 refills | Status: CP
Start: 2020-05-15 — End: ?

## 2020-05-15 NOTE — Procedures
Attending Surgeon: Lizbeth Bark, MD    Anesthesia: Local    Pre-Procedure Diagnosis:   1. Lumbar radiculopathy    2. Sacroiliitis (HCC)        Post-Procedure Diagnosis:   1. Lumbar radiculopathy    2. Sacroiliitis (HCC)        [place] [facility] SPINE INJECT SNRB/TFESI LUMBAR/SACRAL  Procedure: transforaminal epidural    Laterality: right    Location: lumbar -  L5-S1      Consent:   Consent obtained: verbal and written  Consent given by: patient  Risks discussed: bleeding, bruising, infection, weakness and no change or worsening in pain    Discussed with patient the purpose of the treatment/procedure, other ways of treating my condition, including no treatment/ procedure and the risks and benefits of the alternatives. Patient has decided to proceed with treatment/procedure.        Universal Protocol:  Relevant documents: relevant documents present and verified  Test results: test results available and properly labeled  Imaging studies: imaging studies available  Required items: required blood products, implants, devices, and special equipment available  Site marked: the operative site was marked  Patient identity confirmed: Patient identify confirmed verbally with patient.        Time out: Immediately prior to procedure a time out was called to verify the correct patient, procedure, equipment, support staff and site/side marked as required      Procedures Details:   Indications: pain   Prep: chlorhexidine  Patient position: prone  Estimated Blood Loss: minimal  Number of Joints: 1  Guidance: fluoroscopy  Contrast: Procedure confirmed with contrast under live fluoroscopy.  Needle and Epidural Catheter: tuohy  Needle size: 25 G  Injection procedure: Negative aspiration for blood and Incremental injection  Patient tolerance: Patient tolerated the procedure well with no immediate complications. Pressure was applied, and hemostasis was accomplished.  Comments: The procedure risks and benefits were explained to the patient. Informed consent was obtained.  The patient was placed in the prone position on the fluoroscopy table with a pillow under the abdomen to help reduce lumbar lordosis.  Blood pressure cuff and oxygen saturation monitor were attached and the patient was monitored throughout the entire procedure.  The L5 vertebral body was identified with the use of fluoroscopy in the AP view; the C-arm was obliqued to obtain a [employer].  The right L5 pedicle was visualized.  The skin was prepped using Chlorhexadine and draped in aseptic fashion.  The C-arm was rotated slightly obliquely towards the side to visualize the area just below the foramen.  Skin and subcutaneous tissue were anesthetized using 3 mL of 1 percent lidocaine with a 27-gauge, 1-1/2 inch needle.  Next, a 3-1/2  inch 25-gauge spinal needle was slowly advanced to the 6 o'clock position of the L5 pedicle just cephalad to the superior articular process.  The latter part of the needle advancement was performed with the C-arm in the lateral view.  When the needle tip was visualized to be in the L5 neural foramen, 1 mL of Isovue contrast dye was injected.  There was spread of dye revealing L5 nerve root.  After negative aspiration, a 3 mL solution containing 80 mg of triamcinolone and 1 mL of 1% lidocaine was injected in increments. The stylet was reinserted and then removed.   After the procedure, the patient's blood pressure, heart rate, oxygen saturation, and VAS pain score were recorded in the chart.         There were  no complications.  The patient tolerated the procedure well and was brought to the room for observation in stable condition and discharged with written discharge instructions.     PLAN OF CARE:  The patient is to follow up in the interventional spine clinic in 6 weeks.    The patient was advised to contact the interventional spine center for any of the following:    1. Fever, chills, or night sweats.  2. New onset severe, sharp pain.  3. Any new upper or lower extremity weakness or numbness.  4. Any questions regarding the procedure.     If unable to contact the interventional spine center, the patient was instructed to go to the local emergency room.       [place] AMB SPINE SI JOINT INJECT ANESTH/STEROID PROC  Location: sacroiliac joint R sacroiliac joint    Consent:   Consent obtained: written and verbal  Consent given by: patient  Risks discussed: damage to surrounding structures, infection and pain  Alternatives discussed: alternative treatment     Universal Protocol:  Relevant documents: relevant documents present and verified  Test results: test results available and properly labeled  Imaging studies: imaging studies available  Required items: required blood products, implants, devices, and special equipment available  Site marked: the operative site was marked  Patient identity confirmed: Patient identify confirmed verbally with patient.        Time out: Immediately prior to procedure a time out was called to verify the correct patient, procedure, equipment, support staff and site/side marked as required      Procedures Details:   Indications: pain   Prep: 2% chlorhexidine  Guidance: fluoroscopy  Needle size: 22 G  Approach: posterior  Patient tolerance: Patient tolerated the procedure well with no immediate complications. Pressure was applied, and hemostasis was accomplished.  Comments: The procedure risks and benefits were explained.  Informed consent was obtained from the patient.  The patient was placed in prone position on the fluoroscopy table.  Blood pressure cuff and oxygen saturation monitors were attached and the patient was monitored throughout the procedure.  The right SI joint was identified with the use of fluoroscopy in the AP view.  The skin was prepped using Chlorhexadine and draped in aseptic fashion.  C-arm was rotated obliquely towards the left side to line up the anterior and posterior margins of the SI joint.  Skin and subcutaneous tissue were anesthetized using 2.5 mL of 1 percent lidocaine with 25-gauge needle.  A 3-1/2-inch 22-gauge spinal needle was advanced parallel to the x-ray beam towards the lower third of the joint line.  The needle was advanced slowly until the tip of the needle made contact with the bone.  The needle was walked off from the bone to the joint space.  We subsequently injected 0.4 mL of Isovue contrast dye.  An arthrogram was seen.  After negative aspiration, a solution containing 1 mL of 1 percent lidocaine and 40 mg of triamcinolone was injected.  The stylet was re-inserted and needle was then removed.  There was no sensory or motor deficit in the extremity noted.     After the procedure, the patient's blood pressure, heart rate, oxygen saturation, and VAS were recorded in the chart.  There were no complications.  The patient tolerated the procedure well and was brought to the recovery room for observation in stable condition and discharged with written discharge instructions.     PLAN OF CARE:  The patient is to follow up in  the interventional spine clinic in 6-8 weeks.     The patient was advised to contact the interventional spine center for any of the following:    1. Fever, chills, or night sweats.  2. New onset severe sharp pain.  3. Any new upper or lower extremity weakness or numbness.  4. Any questions regarding the procedure.     If unable to contact the interventional spine center, the patient was instructed to go to the local emergency room.           Estimated blood loss: none or minimal  Specimens: none  Patient tolerated the procedure well with no immediate complications. Pressure was applied, and hemostasis was accomplished.

## 2020-05-15 NOTE — Progress Notes
SPINE CENTER  INTERVENTIONAL PAIN PROCEDURE HISTORY AND PHYSICAL    No chief complaint on file.      HISTORY OF PRESENT ILLNESS:  Erin Sanchez is a 59 y.o. year old female who presents for injection.  Denies fevers, chills, or recent hospitalizations.  Patient denies currently taking blood thinning medications.       Medical History:   Diagnosis Date   ? Anxiety disorder    ? Cirrhosis (HCC)    ? Diabetes (HCC)    ? Dizziness    ? Hearing loss    ? Hepatitis C     per labs on 02/15/2020 Hep C negative.      ? History of hypertension    ? Liver disease     HEP C -SPLEEN   ? Somatic dysfunction of thoracic region    ? Unspecified deficiency anemia        Surgical History:   Procedure Laterality Date   ? HX ENDOSCOPY  2012   ? COLONOSCOPY  2012   ? Colonoscopy N/A 04/02/2020    Performed by Samuel Jester, MD at [facility] OR   ? ESOPHAGOGASTRODUODENOSCOPY WITH BIOPSY - FLEXIBLE N/A 04/02/2020    Performed by Samuel Jester, MD at [facility] OR   ? ESOPHAGUS SURGERY     ? HX SHOULDER REPLACEMENT      right   ? KNEE REPLACEMENT      left       family history includes Cancer in her mother; Heart Attack in her mother; Other in her father; Stroke in her father.    Social History     Socioeconomic History   ? Marital status: Widowed     Spouse name: Not on file   ? Number of children: Not on file   ? Years of education: Not on file   ? Highest education level: Not on file   Occupational History     Employer: [employer]   Tobacco Use   ? Smoking status: Former Smoker     Types: Cigarettes     Quit date: 03/09/1998     Years since quitting: 22.2   ? Smokeless tobacco: Never Used   Substance and Sexual Activity   ? Alcohol use: Yes     Alcohol/week: 2.5 standard drinks     Types: 3 Standard drinks or equivalent per week     Comment: socially   ? Drug use: No   ? Sexual activity: Not on file   Other Topics Concern   ? Not on file   Social History Narrative   ? Not on file       Allergies   Allergen Reactions   ? Codeine ITCHING   ? Ciprofloxacin UNKNOWN     Used for an eye infection, caused patient to be very ill.       There were no vitals filed for this visit.    REVIEW OF SYSTEMS: 10 point ROS obtained and negative except back pain      PHYSICAL EXAM:  General: 59 y.o. female appears stated age, in no acute distress  HEENT: Normocephalic, atraumatic  Neck: No thyroidmegaly  Cardiovascular: Well perfused  Pulmonary: Unlabored respirations  Extremities: No cyanosis, clubbing, or edema  Skin: No lesions seen on exposed skin  Psychiatric:  Appropriate mood and affect  Musculoskeletal: No atrophy.   Neurologic: Antigravity strength in all extremities. CN II -XII grossly intact.  Alert and oriented x 3.  IMPRESSION:    1. Lumbar radiculopathy    2. Sacroiliitis (HCC)         PLAN: Lumbar Transforaminal Epidural Steroid Injection Right L5-S1, Right SI joint injection

## 2020-05-15 NOTE — Patient Instructions
Procedure Completed Today: Lumbar Transforaminal Steroid Injection and Right Sacroiliac Joint Injection    Important information following your procedure today: You may drive today    1. Pain relief may not be immediate. It is possible you may even experience an increase in pain during the first 24-48 hours followed by a gradual decrease of your pain.  2. Though the procedure is generally safe and complications are rare, we do ask that you be aware of any of the following:   ? Any swelling, persistent redness, new bleeding, or drainage from the site of the injection.  ? You should not experience a severe headache.  ? You should not run a fever over 101? F.  ? New onset of sharp, severe back & or neck pain.  ? New onset of upper or lower extremity numbness or weakness.  ? New difficulty controlling bowel or bladder function after the injection.  ? New shortness of breath.    If any of these occur, please call to report this occurrence to a nurse at [phone]. If you are calling after 4:00 p.m., on weekends or holidays please call [phone] and ask to have the resident physician on call for the physician paged or go to your local emergency room.  3. You may experience soreness at the injection site. Ice can be applied at 20 minute intervals. Avoid application of direct heat, hot showers or hot tubs today.  4. Avoid strenuous activity today. You may resume your regular activities and exercise tomorrow.  5. Patients with diabetes may see an elevation in blood sugars for 7-10 days after the injection. It is important to pay close attention to your diet, check your blood sugars daily and report extreme elevations to the physician that treats your diabetes.  6. Patients taking a daily blood thinner can resume their regular dose this evening.  7. It is important that you take all medications ordered by your pain physician. Taking medication as ordered is an important part of your pain care plan. If you cannot continue the medication plan, please notify the physician.     Possible side effects to steroids that may occur:  ? Flushing or redness of the face  ? Irritability  ? Fluid retention  ? Change in women?s menses    The following medications were used: Lidocaine , Bupivicaine  , Triamcinolone  , Decadron and Contrast Dye

## 2020-05-15 NOTE — Progress Notes

## 2020-05-21 ENCOUNTER — Encounter: Admit: 2020-05-21 | Discharge: 2020-05-21 | Payer: MEDICAID

## 2020-06-07 ENCOUNTER — Encounter: Admit: 2020-06-07 | Discharge: 2020-06-07 | Payer: MEDICAID

## 2020-06-07 DIAGNOSIS — E611 Iron deficiency: Secondary | ICD-10-CM

## 2020-06-07 DIAGNOSIS — K7460 Unspecified cirrhosis of liver: Secondary | ICD-10-CM

## 2020-06-17 ENCOUNTER — Encounter: Admit: 2020-06-17 | Discharge: 2020-06-17 | Payer: MEDICAID

## 2020-06-17 NOTE — Telephone Encounter
Called reporting severe back pain since d/c from [facility] center for inpt Covid treatment. She describes "sciatic pain" , reports that she had been getting norco in hospital. And had not recieved rx upon d/c. ESI form Nov. Had helped but has now worn off. She is requesting a pain medication prescription.  Encouraged OTC as tolerated in past and rest/warm/cold compress.   Advised I will forward to Dr Noralyn Pick colleagues for any recommendation. And get back to her. She stated that we are no help to her and sounded discouraged when we ended call.

## 2020-06-25 ENCOUNTER — Encounter: Admit: 2020-06-25 | Discharge: 2020-06-25 | Payer: MEDICAID

## 2020-06-27 ENCOUNTER — Encounter: Admit: 2020-06-27 | Discharge: 2020-06-27 | Payer: MEDICAID

## 2020-06-28 ENCOUNTER — Ambulatory Visit: Admit: 2020-06-28 | Discharge: 2020-06-29 | Payer: MEDICAID

## 2020-06-28 ENCOUNTER — Encounter: Admit: 2020-06-28 | Discharge: 2020-06-28 | Payer: MEDICAID

## 2020-06-28 DIAGNOSIS — D539 Nutritional anemia, unspecified: Secondary | ICD-10-CM

## 2020-06-28 DIAGNOSIS — K769 Liver disease, unspecified: Secondary | ICD-10-CM

## 2020-06-28 DIAGNOSIS — F419 Anxiety disorder, unspecified: Secondary | ICD-10-CM

## 2020-06-28 DIAGNOSIS — B1920 Unspecified viral hepatitis C without hepatic coma: Secondary | ICD-10-CM

## 2020-06-28 DIAGNOSIS — D509 Iron deficiency anemia, unspecified: Secondary | ICD-10-CM

## 2020-06-28 DIAGNOSIS — Z8679 Personal history of other diseases of the circulatory system: Secondary | ICD-10-CM

## 2020-06-28 DIAGNOSIS — M9902 Segmental and somatic dysfunction of thoracic region: Secondary | ICD-10-CM

## 2020-06-28 DIAGNOSIS — E119 Type 2 diabetes mellitus without complications: Secondary | ICD-10-CM

## 2020-06-28 DIAGNOSIS — K7460 Unspecified cirrhosis of liver: Secondary | ICD-10-CM

## 2020-06-28 DIAGNOSIS — R42 Dizziness and giddiness: Secondary | ICD-10-CM

## 2020-06-28 MED ORDER — IRON DEXTRAN IVPB
975 mg | Freq: Once | INTRAVENOUS | 0 refills | Status: CP
Start: 2020-06-28 — End: ?
  Administered 2020-06-28 (×2): 975 mg via INTRAVENOUS

## 2020-06-28 MED ORDER — IRON DEXTRAN IVPB
975 mg | Freq: Once | INTRAVENOUS | 0 refills | Status: CN
Start: 2020-06-28 — End: ?

## 2020-06-28 MED ORDER — IRON DEXTRAN IVPB
25 mg | Freq: Once | INTRAVENOUS | 0 refills | Status: CN
Start: 2020-06-28 — End: ?

## 2020-06-28 MED ORDER — ACETAMINOPHEN 500 MG PO TAB
500 mg | Freq: Once | ORAL | 0 refills | Status: CN
Start: 2020-06-28 — End: ?

## 2020-06-28 MED ORDER — IRON DEXTRAN IVPB
25 mg | Freq: Once | INTRAVENOUS | 0 refills | Status: CP
Start: 2020-06-28 — End: ?
  Administered 2020-06-28 (×2): 25 mg via INTRAVENOUS

## 2020-06-28 MED ORDER — ACETAMINOPHEN 500 MG PO TAB
500 mg | Freq: Once | ORAL | 0 refills | Status: CP
Start: 2020-06-28 — End: ?
  Administered 2020-06-28: 15:00:00 500 mg via ORAL

## 2020-06-28 MED ORDER — DIPHENHYDRAMINE HCL 25 MG PO CAP
25 mg | Freq: Once | ORAL | 0 refills | Status: CN
Start: 2020-06-28 — End: ?

## 2020-06-28 MED ORDER — DIPHENHYDRAMINE HCL 25 MG PO CAP
25 mg | Freq: Once | ORAL | 0 refills | Status: CP
Start: 2020-06-28 — End: ?
  Administered 2020-06-28: 15:00:00 25 mg via ORAL

## 2020-06-28 NOTE — Progress Notes
Patient tolerated Infed infusion; no reaction noted.

## 2020-06-29 DIAGNOSIS — R790 Abnormal level of blood mineral: Principal | ICD-10-CM

## 2020-07-23 ENCOUNTER — Encounter: Admit: 2020-07-23 | Discharge: 2020-07-23 | Payer: MEDICAID

## 2020-07-23 ENCOUNTER — Ambulatory Visit: Admit: 2020-07-23 | Discharge: 2020-07-23 | Payer: MEDICAID

## 2020-07-23 DIAGNOSIS — K7460 Unspecified cirrhosis of liver: Secondary | ICD-10-CM

## 2020-07-23 DIAGNOSIS — M9902 Segmental and somatic dysfunction of thoracic region: Secondary | ICD-10-CM

## 2020-07-23 DIAGNOSIS — M461 Sacroiliitis, not elsewhere classified: Secondary | ICD-10-CM

## 2020-07-23 DIAGNOSIS — K769 Liver disease, unspecified: Secondary | ICD-10-CM

## 2020-07-23 DIAGNOSIS — Z8679 Personal history of other diseases of the circulatory system: Secondary | ICD-10-CM

## 2020-07-23 DIAGNOSIS — F419 Anxiety disorder, unspecified: Secondary | ICD-10-CM

## 2020-07-23 DIAGNOSIS — B1920 Unspecified viral hepatitis C without hepatic coma: Secondary | ICD-10-CM

## 2020-07-23 DIAGNOSIS — M5137 Other intervertebral disc degeneration, lumbosacral region: Secondary | ICD-10-CM

## 2020-07-23 DIAGNOSIS — M5416 Radiculopathy, lumbar region: Secondary | ICD-10-CM

## 2020-07-23 DIAGNOSIS — E119 Type 2 diabetes mellitus without complications: Secondary | ICD-10-CM

## 2020-07-23 DIAGNOSIS — D539 Nutritional anemia, unspecified: Secondary | ICD-10-CM

## 2020-07-23 DIAGNOSIS — R42 Dizziness and giddiness: Secondary | ICD-10-CM

## 2020-07-23 NOTE — Progress Notes
SPINE CENTER CLINIC NOTE       SUBJECTIVE:  60 year old female presenting in follow-up from last visit on 05/15/2020.  At that time we did a right-sided L5-S1 transforaminal epidural steroid injection and a right SI joint junction.  She reports good relief from this injection up until she got Covid in about mid December.  She was hospitalized due to multiple comorbidities.  She reports that during the hospitalization she was mostly on bed rest so her back and muscles started to ache on her.  She reports the pain is back in her low back going down mostly the right leg.  She reports easy fatigability after Covid with diffuse muscle pain as well.       Review of Systems    Current Outpatient Medications:   ?  acetaminophen (TYLENOL) 500 mg tablet, Take 500 mg by mouth twice daily. Max of 4,000 mg of acetaminophen in 24 hours., Disp: , Rfl:   ?  chlorthalidone (HYGROTON) 50 mg tablet, Take 50 mg by mouth., Disp: , Rfl:   ?  ergocalciferol (vitamin D2) (VITAMIN D) 1,250 mcg (50,000 unit) capsule, Take one capsule by mouth every 7 days for 90 days., Disp: 12 capsule, Rfl: 0  ?  HYDROcodone/acetaminophen (NORCO) 5/325 mg tablet, Take one tablet by mouth every 6 hours as needed for Pain, Disp: 60 tablet, Rfl: 0  ?  ibuprofen (MOTRIN) 800 mg tablet, Take 800 mg by mouth twice daily. Take with food., Disp: , Rfl:   ?  metformin HCl (METFORMIN PO), Take  by mouth., Disp: , Rfl:   ?  omeprazole DR (PRILOSEC) 40 mg capsule, Take one capsule by mouth twice daily before meals., Disp: 180 capsule, Rfl: 1  ?  ondansetron (ZOFRAN) 4 mg tablet, Take 4 mg by mouth every 8 hours as needed., Disp: , Rfl:   ?  other medication, 1 Dose daily. Pt. Believes it is called Zetima, an anti-depressant. Dose unknown, Disp: , Rfl:   ?  peg-electrolyte solution (NULYTELY) 420 gram oral solution, Split dose by mouth as directed by GI office, Disp: 4000 mL, Rfl: 0  ?  promethazine (PHENERGAN) 25 mg tablet, Take one tablet by mouth every 4 hours., Disp: 30 tablet, Rfl: 0  ?  triazolam (HALCION) 0.25 mg tablet, Take 0.25 mg by mouth twice daily., Disp: , Rfl:   Allergies   Allergen Reactions   ? Codeine ITCHING   ? Ciprofloxacin UNKNOWN     Used for an eye infection, caused patient to be very ill.     Physical Exam  Vitals:    07/23/20 1433   PainSc: Eight        Pain Score: Eight  There is no height or weight on file to calculate BMI.  Physical Exam ?  Gen: comfortable, NAD ?  HEENT: NCAT, anicteric sclera ?  Card: Extremities warm, well-perfused, cap refill <2sec ?  Pulm: no distress, not cyanotic ?  Abd: soft, non-distended ?  Skin: Skin is warm and dry.  Psychiatric: normal mood and affect. Behavior is normal.     Neuro ?  CNII-XII grossly normal ?  Mental status appropriate     MSK: ?  Inspection: grossly symmetric, no obvious deformity, no erythema ??             IMPRESSION:  1. Sacroiliitis (HCC)    2. Lumbar radiculopathy    3. DDD (degenerative disc disease), lumbosacral          PLAN: Scheduling a repeat  right-sided L5-S1 transforaminal epidural steroid injection with a right SI joint injection.  She was provided with a prescription for formalized aquatic therapy to help with diffuse myofascial pain, post Covid myalgia.

## 2020-08-01 ENCOUNTER — Encounter

## 2020-08-01 DIAGNOSIS — B1920 Unspecified viral hepatitis C without hepatic coma: Secondary | ICD-10-CM

## 2020-08-01 DIAGNOSIS — R42 Dizziness and giddiness: Secondary | ICD-10-CM

## 2020-08-01 DIAGNOSIS — K769 Liver disease, unspecified: Secondary | ICD-10-CM

## 2020-08-01 DIAGNOSIS — K279 Peptic ulcer, site unspecified, unspecified as acute or chronic, without hemorrhage or perforation: Secondary | ICD-10-CM

## 2020-08-01 DIAGNOSIS — Z8679 Personal history of other diseases of the circulatory system: Secondary | ICD-10-CM

## 2020-08-01 DIAGNOSIS — K449 Diaphragmatic hernia without obstruction or gangrene: Principal | ICD-10-CM

## 2020-08-01 DIAGNOSIS — K7460 Unspecified cirrhosis of liver: Secondary | ICD-10-CM

## 2020-08-01 DIAGNOSIS — R140 Abdominal distension (gaseous): Secondary | ICD-10-CM

## 2020-08-01 DIAGNOSIS — F419 Anxiety disorder, unspecified: Secondary | ICD-10-CM

## 2020-08-01 DIAGNOSIS — K219 Gastro-esophageal reflux disease without esophagitis: Secondary | ICD-10-CM

## 2020-08-01 DIAGNOSIS — E119 Type 2 diabetes mellitus without complications: Secondary | ICD-10-CM

## 2020-08-01 DIAGNOSIS — M9902 Segmental and somatic dysfunction of thoracic region: Secondary | ICD-10-CM

## 2020-08-01 DIAGNOSIS — D539 Nutritional anemia, unspecified: Secondary | ICD-10-CM

## 2020-08-01 NOTE — Patient Instructions
Please call the radiology scheduling line to make your appointment for your test: [phone]

## 2020-08-01 NOTE — Progress Notes
Department of Metabolic, Bariatric, and Minimally Invasive Surgery         Reason for Visit:  Evaluate for potential hiatal hernia repair    HPI:  Erin Sanchez is a 60 y.o. female who is referred for evaluation of paraesophageal hernia repair.    Patient has a history of prior hiatal hernia repair in 1999- Open hiatal hernia repair with fundoplication.    Patient reports current symptoms of constant nausea and bloating.  Feels like she need to vomit but has been unable to do so since her initial hiatal hernia repair.  Patient reports bloating and nausea have been chronic issues since initial hiatal hernia repair.    Patient notes she had COVID 05/2020.  Since that time, has noted exacerbation of her shortness of breath.  Feels chest fullness and heaviness that she feels she had these baseline symptoms which she related to her hiatal hernia; however, notes these have worsened/progressed since her COVID infection.  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    Also reports she has 2 herniated disks that also limit her activity- undergoes epidural injections to try and temporize symptoms.    3 years ago, had emergency procedure done at [facility] hospital in which she states she had an emergent abdominal procedure to treat her hiatal hernia; she cannot provide further description of what procedure was performed.  Upon review of Care Everywhere, it appears this was an incarcerated incisional hernia near the umbilicus- operative report not available but Epic records indicate implantation of 3x4cm mesh.    Current evaluation of paresophageal hernia:  10/21 EGD:  LA Grade A esophagitis, large HH, no varices.  CT 04/2020    PMH:  Medical History:   Diagnosis Date   ? Anxiety disorder    ? Cirrhosis (HCC)    ? Diabetes (HCC)    ? Dizziness    ? GERD (gastroesophageal reflux disease)    ? Hearing loss    ? Hepatitis C     per labs on 02/15/2020 Hep C negative.      ? Hepatitis C 2011   ? History of hypertension    ? Liver disease     HEP C -SPLEEN   ? Peptic ulcer    ? Somatic dysfunction of thoracic region    ? Unspecified deficiency anemia        PSH:  Surgical History:   Procedure Laterality Date   ? HERNIA REPAIR  1999   ? HX ENDOSCOPY  2012   ? COLONOSCOPY  2012   ? Colonoscopy N/A 04/02/2020    Performed by Samuel [facility]ter, [facility] at [facility] OR   ? ESOPHAGOGASTRODUODENOSCOPY WITH BIOPSY - FLEXIBLE N/A 04/02/2020    Performed by Samuel Jester, MD at [facility] OR   ? ESOPHAGUS SURGERY     ? HX SHOULDER REPLACEMENT      right   ? KNEE REPLACEMENT      left         Current Medications:    Current Outpatient Medications:   ?  acetaminophen (TYLENOL) 500 mg tablet, Take 500 mg by mouth twice daily. Max of 4,000 mg of acetaminophen in 24 hours., Disp: , Rfl:   ?  buPROPion XL (WELLBUTRIN XL) 150 mg tablet, Take 150 mg by mouth., Disp: , Rfl:   ?  chlorthalidone (HYGROTON) 50 mg tablet, Take 50 mg by mouth., Disp: , Rfl:   ?  cyclobenzaprine (FLEXERIL) 5 mg  tablet, TAKE 1-2 TABLETS (5-10 MG TOTAL) BY MOUTH 3 (THREE) TIMES A DAY AS NEEDED FOR MUSCLE SPASMS., Disp: , Rfl:   ?  HYDROcodone/acetaminophen (NORCO) 5/325 mg tablet, Take one tablet by mouth every 6 hours as needed for Pain, Disp: 60 tablet, Rfl: 0  ?  ibuprofen (MOTRIN) 800 mg tablet, Take 800 mg by mouth twice daily. Take with food., Disp: , Rfl:   ?  metformin HCl (METFORMIN PO), Take  by mouth., Disp: , Rfl:   ?  omeprazole DR (PRILOSEC) 40 mg capsule, Take 1 capsule by mouth daily., Disp: , Rfl:   ?  omeprazole DR (PRILOSEC) 40 mg capsule, Take one capsule by mouth twice daily before meals., Disp: 180 capsule, Rfl: 1  ?  ondansetron (ZOFRAN) 4 mg tablet, Take 4 mg by mouth every 8 hours as needed., Disp: , Rfl:   ?  ondansetron HCL (ZOFRAN) 4 mg tablet, Take 4-8 mg by mouth every 8 hours as needed., Disp: , Rfl:   ?  other medication, 1 Dose daily. Pt. Believes it is called Zetima, an anti-depressant. Dose unknown, Disp: , Rfl:   ? peg-electrolyte solution (NULYTELY) 420 gram oral solution, Split dose by mouth as directed by GI office, Disp: 4000 mL, Rfl: 0  ?  potassium chloride SR (K-DUR) 20 mEq tablet, Take 20 mEq by mouth., Disp: , Rfl:   ?  promethazine (PHENERGAN) 25 mg tablet, Take one tablet by mouth every 4 hours., Disp: 30 tablet, Rfl: 0  ?  traZODone (DESYREL) 50 mg tablet, Take 25-100 mg by mouth daily as needed., Disp: , Rfl:   ?  triazolam (HALCION) 0.25 mg tablet, Take 0.25 mg by mouth twice daily., Disp: , Rfl:   ?  valsartan (DIOVAN) 80 mg tablet, Take 80 mg by mouth., Disp: , Rfl:   ?  venlafaxine XR (EFFEXOR XR) 150 mg capsule, Take 150 mg by mouth., Disp: , Rfl:     Allergies:  Allergies   Allergen Reactions   ? Codeine ITCHING   ? Ciprofloxacin UNKNOWN     Used for an eye infection, caused patient to be very ill.       SHx:  Social History     Tobacco Use   ? Smoking status: Former Smoker     Types: Cigarettes     Quit date: 03/09/1998     Years since quitting: 22.4   ? Smokeless tobacco: Never Used   Substance Use Topics   ? Alcohol use: Yes     Alcohol/week: 2.5 standard drinks     Types: 3 Standard drinks or equivalent per week     Comment: socially   ? Drug use: No       FHx:  Family History   Problem Relation Age of Onset   ? Heart Attack Mother    ? Cancer Mother         Lung   ? Stroke Father    ? Other Father         Heart Disease   ? Celiac Disease Neg Hx    ? Cirrhosis Neg Hx    ? Cancer-Colon Neg Hx    ? Colon Polyps Neg Hx    ? Crohn's Disease Neg Hx    ? Esophageal Cancer Neg Hx    ? Cystic Fibrosis Neg Hx    ? Hemochromatosis Neg Hx    ? Inflammatory Bowel Disease Neg Hx    ? Irritable Bowel Disease Neg Hx    ?  Liver Disease Neg Hx    ? Liver Cancer Neg Hx    ? Rectal Cancer Neg Hx    ? Stomach Cancer Neg Hx    ? Ulcerative Colitis Neg Hx    ? Wilson's Disease Neg Hx    ? Melanoma Neg Hx        ROS:  Review of Systems   Eyes: Positive for visual disturbance.   Respiratory: Positive for shortness of breath. Gastrointestinal: Positive for nausea.        Heart burn   Musculoskeletal: Positive for back pain and myalgias.       PE:  Vitals:    08/01/20 0953   Weight: 88.2 kg (194 lb 6.4 oz)   Height: 167 cm (5' 5.75)   PainSc: Nine        88.2 kg (194 lb 6.4 oz)  Body mass index is 31.62 kg/m?Marland Kitchen  GENERAL:  Alert and oriented x 3, not in acute distress  Becomes short of air with repositioning from sitting to semi-recumbant position in office exam chair  HEENT:  EOMI, no scleral icterus  NECK: Supple, no lymphadenopathy, no bruit  CHEST:  ctab  HEART: Regular rate and rhythm  ABDOMEN:  Obese, soft, non-tender, non-distended, midline scar from xiphoid to supraumbilical region without clear evidence of hernia  EXTREMITIES:  No cyanosis or clubbing   NEURO: CN II-XII grossly intact, no focal deficits      Lab/Radiology/Other Diagnostic Tests:  Hemoglobin   Date Value Ref Range Status   04/22/2020 9.5 (L) 12.0 - 15.0 GM/DL Final     Absolute Monocyte Count   Date Value Ref Range Status   04/22/2020 0.40 0 - 0.80 K/UL Final     Sodium   Date Value Ref Range Status   04/22/2020 143 137 - 147 MMOL/L Final     Potassium   Date Value Ref Range Status   04/22/2020 4.0 3.5 - 5.1 MMOL/L Final     Chloride   Date Value Ref Range Status   04/22/2020 108 98 - 110 MMOL/L Final     CO2   Date Value Ref Range Status   04/22/2020 24 21 - 30 MMOL/L Final     Anion Gap   Date Value Ref Range Status   04/22/2020 11 3 - 12 Final     Blood Urea Nitrogen   Date Value Ref Range Status   04/22/2020 10 7 - 25 MG/DL Final     Creatinine   Date Value Ref Range Status   04/22/2020 0.71 0.4 - 1.00 MG/DL Final     Glucose   Date Value Ref Range Status   04/22/2020 123 (H) 70 - 100 MG/DL Final     Calcium   Date Value Ref Range Status   04/22/2020 8.6 8.5 - 10.6 MG/DL Final     AST (SGOT)   Date Value Ref Range Status   04/22/2020 16 7 - 40 U/L Final     ALT (SGPT)   Date Value Ref Range Status   04/22/2020 18 7 - 56 U/L Final     Alk Phosphatase   Date Value Ref Range Status   04/22/2020 87 25 - 110 U/L Final     No results found for: CHOL, TRIG, HDL, LDL, VLDL  T4-Free   Date Value Ref Range Status   10/07/2011 1.0  Final     TSH   Date Value Ref Range Status   04/05/2012 1.47  Final  TSH Thyroid Screen   Date Value Ref Range Status   07/12/2012 1.46  Final     No results found for: HGBA1C    Sitting and Reading: Never doze  Watching TV: Never doze   Sitting, Inactive in Public Place: Never doze   Passenger in Car/1 hr no break: Never doze   Lying Down to Rest: Slight chance of dozing   Sitting & Talking to Someone: Never doze   Sitting Quietly After Lunch: Never doze   In a Car/Stopped in Traffic: Never doze   Epworth Sleepiness Scale: 1       Loud Snoring: Yes  Tired/Fatigued/Sleepy in Daytime: Yes  Stop Breathing During Sleep: No  Treated for High Blood Pressure: Yes  BMI >35 kg/m2: No  Over 20 Years Old: Yes  Neck Size 16 in or More: Yes  Is the Patient Female?: No  STOPBANG Score: 5    Assessment:  60 y.o. 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  Discussed with patient complex nature of recurrent paraesophageal hernia repair in setting of obesity as well as cirrhosis.    Plan:  Refer to pulmonary rehab.  Esophagram  Gastric emptying study (large hiatal hernia)  Stable cirrhosis, HCV treated.    Patient to call for follow up after additional imaging studies.             Total time 60 minutes.  Estimated counseling time 45 minutes.  Counseled patient regarding required evaluations prior to complex and recurrent paraesophageal hernia repair and optimization of her clinical/pulmonary status.      Bufford Lope, MD 08/01/20

## 2020-08-12 ENCOUNTER — Encounter

## 2020-08-12 DIAGNOSIS — K449 Diaphragmatic hernia without obstruction or gangrene: Secondary | ICD-10-CM

## 2020-08-12 DIAGNOSIS — R140 Abdominal distension (gaseous): Secondary | ICD-10-CM

## 2020-08-12 MED ORDER — BARIUM SULFATE 700 MG PO TAB
700 mg | Freq: Once | ORAL | 0 refills | Status: CP
Start: 2020-08-12 — End: ?
  Administered 2020-08-12: 21:00:00 700 mg via ORAL

## 2020-08-12 MED ORDER — RP DX TC-99M SULF COLL MCI
1 | Freq: Once | ORAL | 0 refills | Status: CP
Start: 2020-08-12 — End: ?
  Administered 2020-08-12: 15:00:00 1 via ORAL

## 2020-08-12 MED ORDER — BARIUM SULFATE 40 % (W/V) PO SUSP
10 mL | Freq: Once | ORAL | 0 refills | Status: CP
Start: 2020-08-12 — End: ?
  Administered 2020-08-12: 21:00:00 60 mL via ORAL

## 2020-08-12 MED ORDER — BARIUM SULFATE 98 % PO SUSR
130 mL | Freq: Once | ORAL | 0 refills | Status: CP
Start: 2020-08-12 — End: ?
  Administered 2020-08-12: 21:00:00 60 mL via ORAL

## 2020-08-12 MED ORDER — SOD BICARB-CITRIC AC-SIMETH 2.21-1.53 GRAM/4 GRAM PO GREP
1 | Freq: Once | ORAL | 0 refills | Status: CP
Start: 2020-08-12 — End: ?

## 2020-08-20 ENCOUNTER — Encounter: Admit: 2020-08-20 | Discharge: 2020-08-20 | Payer: MEDICAID

## 2020-08-21 ENCOUNTER — Encounter: Admit: 2020-08-21 | Discharge: 2020-08-21 | Payer: MEDICAID

## 2020-08-21 ENCOUNTER — Ambulatory Visit: Admit: 2020-08-21 | Discharge: 2020-08-21 | Payer: MEDICAID

## 2020-08-21 DIAGNOSIS — M461 Sacroiliitis, not elsewhere classified: Secondary | ICD-10-CM

## 2020-08-21 DIAGNOSIS — M5416 Radiculopathy, lumbar region: Secondary | ICD-10-CM

## 2020-08-21 MED ORDER — DEXAMETHASONE SODIUM PHOS (PF) 10 MG/ML IJ SOLN
10 mg | Freq: Once | 0 refills | Status: CP
Start: 2020-08-21 — End: ?
  Administered 2020-08-21: 21:00:00 10 mg

## 2020-08-21 MED ORDER — BUPIVACAINE (PF) 0.5 % (5 MG/ML) IJ SOLN
5 mL | Freq: Once | INTRAMUSCULAR | 0 refills | Status: CP
Start: 2020-08-21 — End: ?
  Administered 2020-08-21: 21:00:00 5 mL via INTRAMUSCULAR

## 2020-08-21 MED ORDER — TRIAMCINOLONE ACETONIDE 40 MG/ML IJ SUSP
40 mg | Freq: Once | EPIDURAL | 0 refills | Status: CP
Start: 2020-08-21 — End: ?
  Administered 2020-08-21: 21:00:00 40 mg via EPIDURAL

## 2020-08-21 MED ORDER — IOHEXOL 240 MG IODINE/ML IV SOLN
2.5 mL | Freq: Once | EPIDURAL | 0 refills | Status: CP
Start: 2020-08-21 — End: ?
  Administered 2020-08-21: 21:00:00 2.5 mL via EPIDURAL

## 2020-08-21 NOTE — Progress Notes
SPINE CENTER  INTERVENTIONAL PAIN PROCEDURE HISTORY AND PHYSICAL    Chief Complaint   Patient presents with   ? Procedure       HISTORY OF PRESENT ILLNESS:  Erin Sanchez is a 60 y.o. year old female who presents for injection.  Denies fevers, chills, or recent hospitalizations.  Patient denies currently taking blood thinning medications.       Medical History:   Diagnosis Date   ? Anxiety disorder    ? Cirrhosis (HCC)    ? Diabetes (HCC)    ? Dizziness    ? GERD (gastroesophageal reflux disease)    ? Hearing loss    ? Hepatitis C     per labs on 02/15/2020 Hep C negative.      ? Hepatitis C 2011   ? History of hypertension    ? Liver disease     HEP C -SPLEEN   ? Peptic ulcer    ? Somatic dysfunction of thoracic region    ? Unspecified deficiency anemia        Surgical History:   Procedure Laterality Date   ? HERNIA REPAIR  1999   ? HX ENDOSCOPY  2012   ? COLONOSCOPY  2012   ? Colonoscopy N/A 04/02/2020    Performed by Samuel Jester, MD at [facility] OR   ? ESOPHAGOGASTRODUODENOSCOPY WITH BIOPSY - FLEXIBLE N/A 04/02/2020    Performed by Samuel Jester, MD at [facility] OR   ? ESOPHAGUS SURGERY     ? HX SHOULDER REPLACEMENT      right   ? KNEE REPLACEMENT      left       family history includes Cancer in her mother; Heart Attack in her mother; Other in her father; Stroke in her father.    Social History     Socioeconomic History   ? Marital status: Widowed     Spouse name: Not on file   ? Number of children: Not on file   ? Years of education: Not on file   ? Highest education level: Not on file   Occupational History     Employer: [employer]   Tobacco Use   ? Smoking status: Former Smoker     Types: Cigarettes     Quit date: 03/09/1998     Years since quitting: 22.4   ? Smokeless tobacco: Never Used   Substance and Sexual Activity   ? Alcohol use: Yes     Alcohol/week: 2.5 standard drinks     Types: 3 Standard drinks or equivalent per week     Comment: socially   ? Drug use: No   ? Sexual activity: Not on file   Other Topics Concern   ? Not on file   Social History Narrative   ? Not on file       Allergies   Allergen Reactions   ? Codeine ITCHING   ? Ciprofloxacin UNKNOWN     Used for an eye infection, caused patient to be very ill.       Vitals:    08/21/20 1435 08/21/20 1437   BP: 117/72    BP Source: Arm, Right Upper    Patient Position: Sitting    Pulse: 103    Resp: 14    Temp: 36.7 ?C (98.1 ?F)    TempSrc: Oral    SpO2: 95%    Weight: 88.5 kg (195 lb)    Height: 167.6 cm (5' 6)  PainSc: Eight Eight       REVIEW OF SYSTEMS: 10 point ROS obtained and negative except back pain      PHYSICAL EXAM:  General: 60 y.o. female appears stated age, in no acute distress  HEENT: Normocephalic, atraumatic  Neck: No thyroidmegaly  Cardiovascular: Well perfused  Pulmonary: Unlabored respirations  Extremities: No cyanosis, clubbing, or edema  Skin: No lesions seen on exposed skin  Psychiatric:  Appropriate mood and affect  Musculoskeletal: No atrophy.   Neurologic: Antigravity strength in all extremities. CN II -XII grossly intact.  Alert and oriented x 3.           IMPRESSION:    1. Sacroiliitis (HCC)    2. Lumbar radiculopathy         PLAN: Right L5-S1 Lumbar Transforaminal Epidural Steroid Injection and Sacroiliac Joint Injection Right

## 2020-08-21 NOTE — Progress Notes

## 2020-08-21 NOTE — Procedures
Attending Surgeon: Lizbeth Bark, MD    Anesthesia: Local    Pre-Procedure Diagnosis:   1. Sacroiliitis (HCC)    2. Lumbar radiculopathy        Post-Procedure Diagnosis:   1. Sacroiliitis (HCC)    2. Lumbar radiculopathy        [place] AMB SPINE SI JOINT INJECT ANESTH/STEROID PROC  Location: sacroiliac joint R sacroiliac joint    Consent:   Consent obtained: written and verbal  Consent given by: patient  Risks discussed: damage to surrounding structures, infection and pain  Alternatives discussed: alternative treatment     Universal Protocol:  Relevant documents: relevant documents present and verified  Test results: test results available and properly labeled  Imaging studies: imaging studies available  Required items: required blood products, implants, devices, and special equipment available  Site marked: the operative site was marked  Patient identity confirmed: Patient identify confirmed verbally with patient.        Time out: Immediately prior to procedure a time out was called to verify the correct patient, procedure, equipment, support staff and site/side marked as required      Procedures Details:   Indications: pain   Prep: 2% chlorhexidine  Guidance: fluoroscopy  Needle size: 22 G  Approach: posterior  Patient tolerance: Patient tolerated the procedure well with no immediate complications. Pressure was applied, and hemostasis was accomplished.  Comments: The procedure risks and benefits were explained.  Informed consent was obtained from the patient.  The patient was placed in prone position on the fluoroscopy table.  Blood pressure cuff and oxygen saturation monitors were attached and the patient was monitored throughout the procedure.  The right SI joint was identified with the use of fluoroscopy in the AP view.  The skin was prepped using Chlorhexadine and draped in aseptic fashion.  C-arm was rotated obliquely towards the left side to line up the anterior and posterior margins of the SI joint. Skin and subcutaneous tissue were anesthetized using 2.5 mL of 1 percent lidocaine with 25-gauge needle.  A 3-1/2-inch 22-gauge spinal needle was advanced parallel to the x-ray beam towards the lower third of the joint line.  The needle was advanced slowly until the tip of the needle made contact with the bone.  The needle was walked off from the bone to the joint space.  We subsequently injected 0.4 mL of Isovue contrast dye.  An arthrogram was seen.  After negative aspiration, a solution containing 1 mL of 1 percent lidocaine and 40 mg of triamcinolone was injected.  The stylet was re-inserted and needle was then removed.  There was no sensory or motor deficit in the extremity noted.     After the procedure, the patient's blood pressure, heart rate, oxygen saturation, and VAS were recorded in the chart.  There were no complications.  The patient tolerated the procedure well and was brought to the recovery room for observation in stable condition and discharged with written discharge instructions.     PLAN OF CARE:  The patient is to follow up in the interventional spine clinic in 6-8 weeks.     The patient was advised to contact the interventional spine center for any of the following:    1. Fever, chills, or night sweats.  2. New onset severe sharp pain.  3. Any new upper or lower extremity weakness or numbness.  4. Any questions regarding the procedure.     If unable to contact the interventional spine center, the patient was instructed  to go to the local emergency room.       [place] AMB SPINE INJECT SNRB/TFESI LUMBAR/SACRAL  Procedure: transforaminal epidural    Laterality: right    Location: lumbar -  L5-S1      Consent:   Consent obtained: verbal and written  Consent given by: patient  Risks discussed: bleeding, bruising, infection, weakness and no change or worsening in pain    Discussed with patient the purpose of the treatment/procedure, other ways of treating my condition, including no treatment/ procedure and the risks and benefits of the alternatives. Patient has decided to proceed with treatment/procedure.        Universal Protocol:  Relevant documents: relevant documents present and verified  Test results: test results available and properly labeled  Imaging studies: imaging studies available  Required items: required blood products, implants, devices, and special equipment available  Site marked: the operative site was marked  Patient identity confirmed: Patient identify confirmed verbally with patient.        Time out: Immediately prior to procedure a time out was called to verify the correct patient, procedure, equipment, support staff and site/side marked as required      Procedures Details:   Indications: pain   Prep: chlorhexidine  Patient position: prone  Estimated Blood Loss: minimal  Number of Joints: 1  Guidance: fluoroscopy  Contrast: Procedure confirmed with contrast under live fluoroscopy.  Needle and Epidural Catheter: tuohy  Needle size: 25 G  Injection procedure: Negative aspiration for blood and Incremental injection  Patient tolerance: Patient tolerated the procedure well with no immediate complications. Pressure was applied, and hemostasis was accomplished.  Comments: The procedure risks and benefits were explained to the patient.  Informed consent was obtained.  The patient was placed in the prone position on the fluoroscopy table with a pillow under the abdomen to help reduce lumbar lordosis.  Blood pressure cuff and oxygen saturation monitor were attached and the patient was monitored throughout the entire procedure.  The L5 vertebral body was identified with the use of fluoroscopy in the AP view; the C-arm was obliqued to obtain a [employer].  The right L5 pedicle was visualized.  The skin was prepped using Chlorhexadine and draped in aseptic fashion.  The C-arm was rotated slightly obliquely towards the side to visualize the area just below the foramen.  Skin and subcutaneous tissue were anesthetized using 3 mL of 1 percent lidocaine with a 27-gauge, 1-1/2 inch needle.  Next, a 3-1/2  inch 25-gauge spinal needle was slowly advanced to the 6 o'clock position of the L5 pedicle just cephalad to the superior articular process.  The latter part of the needle advancement was performed with the C-arm in the lateral view.  When the needle tip was visualized to be in the L5 neural foramen, 1 mL of Isovue contrast dye was injected.  There was spread of dye revealing L5 nerve root.  After negative aspiration, a 3 mL solution containing 80 mg of triamcinolone and 1 mL of 1% lidocaine was injected in increments. The stylet was reinserted and then removed.   After the procedure, the patient's blood pressure, heart rate, oxygen saturation, and VAS pain score were recorded in the chart.         There were no complications.  The patient tolerated the procedure well and was brought to the room for observation in stable condition and discharged with written discharge instructions.     PLAN OF CARE:  The patient is to follow up  in the interventional spine clinic in 6 weeks.    The patient was advised to contact the interventional spine center for any of the following:    1. Fever, chills, or night sweats.  2. New onset severe, sharp pain.  3. Any new upper or lower extremity weakness or numbness.  4. Any questions regarding the procedure.     If unable to contact the interventional spine center, the patient was instructed to go to the local emergency room.     This patient's clinical history, exam, AND imaging support radiculopathy AND there is a significant impact on quality of life and function AND their pain score has been documented in this note AND the pain has been present for at least 4 weeks AND they have failed to improve with noninvasive conservative care.             Estimated blood loss: none or minimal  Specimens: none  Patient tolerated the procedure well with no immediate complications. Pressure was applied, and hemostasis was accomplished.

## 2020-08-27 ENCOUNTER — Encounter: Admit: 2020-08-27 | Discharge: 2020-08-27 | Payer: MEDICAID

## 2020-08-27 DIAGNOSIS — D509 Iron deficiency anemia, unspecified: Secondary | ICD-10-CM

## 2020-08-27 DIAGNOSIS — K279 Peptic ulcer, site unspecified, unspecified as acute or chronic, without hemorrhage or perforation: Secondary | ICD-10-CM

## 2020-08-27 MED ORDER — OMEPRAZOLE 40 MG PO CPDR
ORAL_CAPSULE | Freq: Two times a day (BID) | 1 refills
Start: 2020-08-27 — End: ?

## 2020-08-29 ENCOUNTER — Encounter: Admit: 2020-08-29 | Discharge: 2020-08-29 | Payer: MEDICAID

## 2020-09-24 ENCOUNTER — Encounter: Admit: 2020-09-24 | Discharge: 2020-09-24 | Payer: MEDICAID

## 2020-09-24 DIAGNOSIS — U099 Post-COVID syndrome: Secondary | ICD-10-CM

## 2020-09-24 DIAGNOSIS — R0600 Dyspnea, unspecified: Secondary | ICD-10-CM

## 2020-09-24 NOTE — Progress Notes
Name: Erin Sanchez           MRN: [id number]                 DOB: Oct 19, 1960          Age: 60 y.o.    Outpatient Pulmonary Rehabilitation Orientation Note:    Erin Sanchez completed an initial orientation appointment for starting the Outpatient Pulmonary Rehabilitation program today.  The patient was referred with a primary diagnosis of Dyspnea/ Post COVID.  We discussed the details of the program including program goals, schedule, patient expectations, etc.  The patient plans to attend the program twice per week starting on 10/01/20.  The patient's medical history, risk factors, medication list, and symptoms were reviewed.  Pt completed an initial QOL assessment, nutritional assessment, depression screening, and exercise assessment.  Home exercise and activity recommendations discussed.  An initial Individualized Treatment Plan (ITP) was created, which includes the patient goals and initial exercise prescription. ITP was sent to the Pulmonary Rehab medical director for review.  We will plan to progress the patient as tolerated.  All the patient's questions were answered at this time.    Initial Weight: 196.1lbs  Resting Heart Rate: 103  Resting Blood Pressure: 120/74  Rhythm: NSR/ST  MMRC score: 0  PHQ-9 (depression) score: 1  First Day Exercise: 09/24/20  First Day MET:2.9  6MWT:1239ft  Supplemental O2: Room air  Lowest SpO2: 90  Symptoms noted: SOA  Special needs and considerations: No needs noted   Patient goals: Pt hopes to increase her strength and stamina by establishing an exercise routine. Pt wants to improve her breathing and lungs so she can have upcoming surgeries.         Name: Erin Sanchez           MRN: [id number]                 DOB: 27-Feb-1961          Age: 60 y.o.    PULMONARY REHAB INDIVIDUALIZED TREATMENT PLAN - Initial Assessment          ITP Date: 09/24/2020  Diagnosis: Dyspnea/Post COVID  Physician: Dr. Boykin Peek    Oxygen    Assessment   [x]  Not applicable, Patient does not use oxygen           O2 Usage(l/min)                           Rest 0  ADL 0  Exercise 0  Sleep 0    Home Systems Used: None    Comments: Pt does not use portable oxygen, she is strictly on room air.     Plan (Goals, Intervention, Education)   Goals    [x]  Maintain O2 saturation >90% during rest and with exercise  []  Managing O2 independently  O2 Goals (if applicable): None     Intervention   []  Train in O2 safety  []  Appropriate use of O2 with rest and exercise  []  Titrate O2 based on response to exercise  []  Other: N/A     Education    none      Pt. Response: N/A    Exercise     Assessment    : 1280 feet      118.91 meters    RPE: 6  RPD: 2    Home Freq: 0  days/week    Home Dur: 0 minutes  Comments: Pt reports no structured exercise at home. She does reports doing daily ADL's. Pt mentions she has had a rough year with COVID and orthopedic troubles, which has made it difficult to establish an exercise routine.     Plan (Goals, Intervention, Education)   Goals    [x]  Exercise at least 150 minutes/week  [x]  Exercise at home 2-3 days/week    Exercise Goals: Improve strength and stamina by establishing a regular exercise routine., Establish an exercise program 5-7 days per week 30-60 minutes per day. and Improve my breathing and reduce SOB through establishing a regular exercise routine.     Intervention  Exercise Prescription:  Mode/Duration/Intensity:   Treadmill: 12 minutes/bout, 2.1 mph @ 0% grade  Bike: 14 minutes/bout, 14 average watts  Stepper:  14 minutes/bout, 22 average watts  UBE: 12 minutes/bout, 12 average watts    Freq (d/week):  2   HR Range: Rest + 20-30 bpm  METs: 2.9  Progression:  Increase exercise weekly ~10% using RPE and maintaining O2 saturations above 90%  O2 prescription: Room Air     Education    Home exercise guidelines     Date: 09/24/2020      Pt. Response: Verbalized Understanding    Nutrition    Assessment    Weight: 196.1    Ht: 65       BMI: 32.62        []  Underweight (<18.5) []  Normal (18.5-24.9)          []  Overweight (25-29.9)   [x]  Obese(>29.9)        []  Drug/Alcohol Use: None  Days/week: 0    Drinks/day: 0    Lab Results   Component Value Date    AST 16 04/22/2020    ALT 18 04/22/2020       Comments: Pt reports that she does not follow any specific diet. She admits that she know she needs to watch her sodium. She reports eating a lot of fresh fruits and vegetables.     Plan (Goals, Intervention, Education)   Goals    [x]  Optimal Weight   [x]  Goal Weight: No specific goal weight in mind, but pt hopes to decrease weight.   Dietary Goals: Weight loss of 1-2 lbs per week through healthy dietary change and increased exercise, Follow a low sodium diet of <2,000 mg per day. and Work on portion control     Intervention  []  Dietitian consult               RYP: 37  [x]  Declined    [x]  Staff/Patient Discussion  []  Other:N/A     Education    None      Pt. Response: N/A    Psychosocial Assessment    Assessment    [place] (SGRQ): Total  25.49   Imp 13.79  Symp 38.7  Act 37.79      PHQ-9: 1     None (0-4)    [x]  History of Depression/Anxiety    []  Medication: None    [x]  Family/Friend Support  Support: Children, neighbors     Comments: Pt reports a history of depression and anxiety, but is currently managing it well. Pt lost her husband 6 years ago at the age of 57 and was left raising her 5 children. Pt also has a high functioning autistic child. Pt reports she does not have much family left, but her children provide great support for her (ranging from 42-18 years). Pt mentions she has  a lot of motivation to take care of herself mentally and physically due to being the only parent left for her children. Provided turing point information.      Plan (Goals, Intervention, Education)   Goals  [x]  Stress-management techniques  [x]  Absence of depression  [x]  Sleep management techniques  []  Increase support: Family/Friend/Therapist  []  Reduce PHQ-9 class   Psychosocial Goals: Improve QOL by being able to return to all ADLs and recreational activities., Improve QOL by reducing SOB and being able to increase activity level. and Improve mental health by improving sleep habits and getting on a regular sleep routine     Intervention  []  Physician referral (PHQ-9 score >9)  [x]  Identify stressors  [x]  Staff/Patient Discussion  []  Other:N/A    Comments: No concerns at this time. Pt provided turning point information pt reports she is doing well mentally at this time. [place] people are always willing to help if she ask. She also mentions that her children that still live at home provide great support.      Education    Magazine features editor and available resources     Date: 09/24/2020      Pt. Response: Verbalized Understanding    Prevent/Manage Exacerbations (Other Core Components)    Assessment    [x]  Hospitalized in last 12 months: Yes COVID 05/2020    [x]  Last respiratory infection: 05/2020    Vaccines:  Flu:  Declined        Pneum: Declined    Comments: Pt educated on the signs and symptoms and when to report to MD. Pt has remained out of the hospital since her COVID infection in 05/2020.    Plan (Goals, Intervention, Education)   Goals   [x]  Develop/Review Action plan  [x]  Identify triggers/irritants  [x]  Receive vaccinations as applicable  Goals: Learn signs and symptoms to watch for, and when to notify my MD., Follow a low sodium diet of <2,000 mg per day. and Weigh daily and report any gains of >3lbs/day or >5 lbs/wk to my doctor     Intervention  [x]  Discuss triggers/irritants   []  Notify MD of change in health status  []  Other: N/A     Education    None      Pt. Response: N/A    Tobacco Cessation (Other Core Components)    Assessment    Tobacco use:  []  Never smoked/used  [x]  Quit > 6 mo. ago   []  Quit < 40mo    [] Current smoker     []  Smokeless Tobacco    Quit Date: 03/09/1998 PPD: Socially     Years Smoked: on and off for 20 years    Comments: Pt smoked socially on and off for 20 years. She has remained tobacco free for about 25 years. Pt is not around second hand smoke. She has no desire to begin smoking again.     Plan (Goals, Intervention, Education)  [x]  Not Applicable, Pt is not a tobacco user   Goals  [x]  Complete cessation  []  Medication compliance  Goals: Remain smoke-free     Intervention  []  Smoking Cessation Referral  []  Idama[facility] Schuller Quit Line Information Given      []  Staff/Patient discussion  []  Alternatives (gum, patch, meds, etc.)  []  Other: N/A     Education    None      Pt. Response:N/A    Dyspnea (Other Core Component)    Assessment  MMRC: 0   BODE index: N/A, for COPD only    PFT Results: FVC   FVC%   FEV1           FEV1%    FEV1/FVC     Gold classification (COPD only): N/a    Dyspnea: With ADLs and On Exertion/With Exercise    Plan (Goals, Intervention, Education)   Goals  [x]  Improve MMRC score   [x]  Reduce BODE index  Goals: Reduce shortness of breath through increased activity and completion of Pulmonary Rehab., Improve shortness of breath through weight loss and increased activity., Learn how to control by breathing during activity or when I become short of breath. and Learn breathing techniques to help by breathing.     Intervention  [x]  Pt. demonstrates pursed lip breathing  []  Other: N/A     Education    None      Pt. Response: N/A    BP Control (Other Core Component)    Assessment    BP: 120/74         History of Hypertension: Yes    [x]  Medication: Chlorthalidone     Comments: Pt has a history of hypertension, but is able to manage it through diet and medications. Pt has a blood pressure cuff at home but does not take it consistently. Encouraged home monitoring.     Plan (Goals, Intervention, Education)   Goals  [x]  BP <130/80  [x]  Medication Compliance    Goals: Monitor Blood Pressure daily at home and notify MD of elevated readings and Maintain BP <130/80 through increased activity level, low sodium diet, medication compliance, etc.      Intervention  []  Notify PCP [x]  Staff/Patient Discussion     Education    Home Blood Pressure Monitoring       Date: 09/24/2020      Pt. Response: Verbalized Understanding    Diabetes (Other Core Component)    Assessment    []  Not applicable                  HgA1c: 6.6    [x]  Medication: Metformin     Comments: Pt reports having a blood sugar meter, but does not take it consistently. She takes it every other week. Last time she checked it was 115. Pt reports compliance with her medications. Encouraged home monitoring.     Plan (Goals, Intervention, Education)  []  Not applicable, Patient is not diabetic   Goals  [x]  HbgA1c equal to or less than 7% (optimal)  [x]  Medication Compliance  [x]  Monitor BS at home  Goals: Improve blood sugar management through increased exercise, improved diet, and weight loss. and Lose weight to help with diabetes management     Intervention  []  Referral to diabetic educator  [x]  Staff/Pt. Discussion  []  Other: N/A     Education    Home Blood Glucose Monitoring      Date: 09/24/2020      Pt. Response: Verbalized Understanding    Medications    Assessment    Beta Agonist:         []  Long-acting  [] Short-acting    Anticholinergics:     []  Long-acting  []  Short-acting   Corticosteroids:  []  Inhaled         []  Oral    Plan (Goals, Intervention, Education)  Goals  [x]  Med Review completed  [x]  Compliance 80% or greater  [x]  Demonstrate proper medication technique   Goals: Maintain compliance with all prescribed medications,  and notify MD of any medications concerns.    Intervention  [x]  Staff/Patient Discussion  [x]  Medication Review  []  Refer to MD for medication issues  []  Other: N/A    Education    None      Pt. Response: N/A      Additional Comments:   Pt completed an initial orientation appointment for starting the Outpatient Pulmonary Rehab program.  Pt plans to attend twice per week starting on 10/01/2020.  Pt completed all initial assessments and paperwork today, including initial exercise assessment. VS and telemetry within normal limits. Pt did not report any unusual symptoms.  Education provided on home exercise recommendations, signs/symptoms, proper oxygen usage, daily weighing, etc.  Medications reviewed. We will plan to progress as tolerated to help the patient work meeting their goals. All questions answered at this time.

## 2020-10-01 ENCOUNTER — Encounter: Admit: 2020-10-01 | Discharge: 2020-10-01 | Payer: MEDICAID

## 2020-10-01 DIAGNOSIS — U099 Post-COVID syndrome: Secondary | ICD-10-CM

## 2020-10-01 DIAGNOSIS — R0600 Dyspnea, unspecified: Secondary | ICD-10-CM

## 2020-10-03 ENCOUNTER — Encounter: Admit: 2020-10-03 | Discharge: 2020-10-03 | Payer: MEDICAID

## 2020-10-03 DIAGNOSIS — U099 Post-COVID syndrome: Secondary | ICD-10-CM

## 2020-10-08 ENCOUNTER — Encounter: Admit: 2020-10-08 | Discharge: 2020-10-08 | Payer: MEDICAID

## 2020-10-08 DIAGNOSIS — U099 Post-COVID syndrome: Secondary | ICD-10-CM

## 2020-10-10 ENCOUNTER — Encounter: Admit: 2020-10-10 | Discharge: 2020-10-10 | Payer: MEDICAID

## 2020-10-10 DIAGNOSIS — U099 Post-COVID syndrome: Secondary | ICD-10-CM

## 2020-10-17 ENCOUNTER — Encounter: Admit: 2020-10-17 | Discharge: 2020-10-17 | Payer: MEDICAID

## 2020-10-17 DIAGNOSIS — U099 Post-COVID syndrome: Secondary | ICD-10-CM

## 2020-10-18 ENCOUNTER — Encounter: Admit: 2020-10-18 | Discharge: 2020-10-18 | Payer: MEDICAID

## 2020-10-22 ENCOUNTER — Encounter: Admit: 2020-10-22 | Discharge: 2020-10-22 | Payer: MEDICAID

## 2020-10-22 ENCOUNTER — Ambulatory Visit: Admit: 2020-10-22 | Discharge: 2020-10-22 | Payer: MEDICAID

## 2020-10-22 DIAGNOSIS — Z8679 Personal history of other diseases of the circulatory system: Secondary | ICD-10-CM

## 2020-10-22 DIAGNOSIS — R42 Dizziness and giddiness: Secondary | ICD-10-CM

## 2020-10-22 DIAGNOSIS — M9902 Segmental and somatic dysfunction of thoracic region: Secondary | ICD-10-CM

## 2020-10-22 DIAGNOSIS — K219 Gastro-esophageal reflux disease without esophagitis: Secondary | ICD-10-CM

## 2020-10-22 DIAGNOSIS — E119 Type 2 diabetes mellitus without complications: Secondary | ICD-10-CM

## 2020-10-22 DIAGNOSIS — K279 Peptic ulcer, site unspecified, unspecified as acute or chronic, without hemorrhage or perforation: Secondary | ICD-10-CM

## 2020-10-22 DIAGNOSIS — K7460 Unspecified cirrhosis of liver: Secondary | ICD-10-CM

## 2020-10-22 DIAGNOSIS — D539 Nutritional anemia, unspecified: Secondary | ICD-10-CM

## 2020-10-22 DIAGNOSIS — B1920 Unspecified viral hepatitis C without hepatic coma: Secondary | ICD-10-CM

## 2020-10-22 DIAGNOSIS — U099 Post-COVID syndrome: Secondary | ICD-10-CM

## 2020-10-22 DIAGNOSIS — M461 Sacroiliitis, not elsewhere classified: Secondary | ICD-10-CM

## 2020-10-22 DIAGNOSIS — K769 Liver disease, unspecified: Secondary | ICD-10-CM

## 2020-10-22 DIAGNOSIS — M25559 Pain in unspecified hip: Secondary | ICD-10-CM

## 2020-10-22 DIAGNOSIS — F419 Anxiety disorder, unspecified: Secondary | ICD-10-CM

## 2020-10-22 MED ORDER — NABUMETONE 750 MG PO TAB
750 mg | ORAL_TABLET | Freq: Two times a day (BID) | ORAL | 1 refills | Status: AC
Start: 2020-10-22 — End: ?

## 2020-10-22 MED ORDER — DICLOFENAC SODIUM 1 % TP GEL
4 g | Freq: Four times a day (QID) | TOPICAL | 1 refills | 19.00000 days | Status: AC
Start: 2020-10-22 — End: ?

## 2020-10-22 NOTE — Progress Notes
[facility] CLINIC NOTE       SUBJECTIVE:  60 year old female presenting in follow-up from last visit on 08/22/2018.  At that time we did a right L5-S1 transforaminal epidural steroid injection and right SI joint injection.  She reports good relief of pain going down the leg.  She had relief of axial low back pain but the pain is slowly returning.  She is now experiencing pain in bilateral lateral hips.  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  Her bilateral low back pain is her biggest concern, second is her bilateral hip pain.         Review of Systems    Current Outpatient Medications:   ?  acetaminophen (TYLENOL) 500 mg tablet, Take 500 mg by mouth twice daily. Max of 4,000 mg of acetaminophen in 24 hours., Disp: , Rfl:   ?  buPROPion XL (WELLBUTRIN XL) 150 mg tablet, Take 150 mg by mouth., Disp: , Rfl:   ?  chlorthalidone (HYGROTON) 50 mg tablet, Take 50 mg by mouth., Disp: , Rfl:   ?  cyclobenzaprine (FLEXERIL) 5 mg tablet, TAKE 1-2 TABLETS (5-10 MG TOTAL) BY MOUTH 3 (THREE) TIMES A DAY AS NEEDED FOR MUSCLE SPASMS., Disp: , Rfl:   ?  HYDROcodone/acetaminophen (NORCO) 5/325 mg tablet, Take one tablet by mouth every 6 hours as needed for Pain, Disp: 60 tablet, Rfl: 0  ?  ibuprofen (MOTRIN) 800 mg tablet, Take 800 mg by mouth twice daily. Take with food., Disp: , Rfl:   ?  metformin HCl (METFORMIN PO), Take  by mouth., Disp: , Rfl:   ?  omeprazole DR (PRILOSEC) 40 mg capsule, TAKE 1 CAPSULE BY MOUTH TWICE A DAY BEFORE MEALS, Disp: 180 capsule, Rfl: 1  ?  omeprazole DR (PRILOSEC) 40 mg capsule, Take 1 capsule by mouth daily., Disp: , Rfl:   ?  ondansetron (ZOFRAN) 4 mg tablet, Take 4 mg by mouth every 8 hours as needed., Disp: , Rfl:   ?  ondansetron HCL (ZOFRAN) 4 mg tablet, Take 4-8 mg by mouth every 8 hours as needed., Disp: , Rfl:   ?  other medication, 1 Dose daily. Pt. Believes it is called Zetima, an anti-depressant. Dose unknown, Disp: , Rfl:   ?  peg-electrolyte solution (NULYTELY) 420 gram oral solution, Split dose by mouth as directed by GI office, Disp: 4000 mL, Rfl: 0  ?  potassium chloride SR (K-DUR) 20 mEq tablet, Take 20 mEq by mouth., Disp: , Rfl:   ?  promethazine (PHENERGAN) 25 mg tablet, Take one tablet by mouth every 4 hours., Disp: 30 tablet, Rfl: 0  ?  traZODone (DESYREL) 50 mg tablet, Take 25-100 mg by mouth daily as needed., Disp: , Rfl:   ?  triazolam (HALCION) 0.25 mg tablet, Take 0.25 mg by mouth twice daily., Disp: , Rfl:   ?  valsartan (DIOVAN) 80 mg tablet, Take 80 mg by mouth., Disp: , Rfl:   ?  venlafaxine XR (EFFEXOR XR) 150 mg capsule, Take 150 mg by mouth., Disp: , Rfl:   Allergies   Allergen Reactions   ? Codeine ITCHING   ? Ciprofloxacin UNKNOWN     Used for an eye infection, caused patient to be very ill.     Physical Exam  Vitals:    10/22/20 1515   PainSc: Eight        Pain Score: Eight  There is no height or  weight on file to calculate BMI. ?  Gen: comfortable, NAD ?  HEENT: NCAT, anicteric sclera ?  Card: Extremities warm, well-perfused, cap refill <2sec ?  Pulm: no distress, not cyanotic ?  Abd: soft, non-distended ?  Skin: Skin is warm and dry.  Psychiatric: normal mood and affect. Behavior is normal.     Neuro ?  CNII-XII grossly normal ?  Mental status appropriate     MSK: ?  Inspection: grossly symmetric, no obvious deformity, no erythema ?  Palpation: Tender palpation bilateral SI joints, bilateral greater trochanteric bursa.  Maneuvers: Patrick's, Gaenslen's, SI compression all positive.             IMPRESSION:  1. Sacroiliitis (HCC)    2. Greater trochanteric pain syndrome          PLAN: Scheduling bilateral sacroiliac joint injections to be done in a month from now.  We will do bilateral greater trochanteric bursa injections at that time if needed as well.  She was prescribed Relafen and Voltaren gel to get her to the injection.  She was also provided with physician directed home exercise program to work on greater trochanteric pain syndrome.  She was instructed to use ice liberally on her bilateral hips.

## 2020-10-24 ENCOUNTER — Encounter: Admit: 2020-10-24 | Discharge: 2020-10-24 | Payer: MEDICAID

## 2020-10-24 DIAGNOSIS — U099 Post-COVID syndrome: Secondary | ICD-10-CM

## 2020-10-29 ENCOUNTER — Encounter: Admit: 2020-10-29 | Discharge: 2020-10-29 | Payer: MEDICAID

## 2020-10-29 DIAGNOSIS — R0600 Dyspnea, unspecified: Secondary | ICD-10-CM

## 2020-10-29 DIAGNOSIS — U099 Post-COVID syndrome: Secondary | ICD-10-CM

## 2020-11-04 ENCOUNTER — Encounter: Admit: 2020-11-04 | Discharge: 2020-11-04 | Payer: MEDICAID

## 2020-11-06 ENCOUNTER — Observation Stay: Admit: 2020-11-06 | Discharge: 2020-11-06 | Payer: MEDICAID

## 2020-11-06 ENCOUNTER — Encounter: Admit: 2020-11-06 | Discharge: 2020-11-06 | Payer: MEDICAID

## 2020-11-06 ENCOUNTER — Emergency Department: Admit: 2020-11-06 | Discharge: 2020-11-06 | Payer: MEDICAID

## 2020-11-06 DIAGNOSIS — R1013 Epigastric pain: Secondary | ICD-10-CM

## 2020-11-06 DIAGNOSIS — R112 Nausea with vomiting, unspecified: Secondary | ICD-10-CM

## 2020-11-06 LAB — PHOSPHORUS
PHOSPHORUS: 3.3 mg/dL — ABNORMAL HIGH (ref 2.0–4.5)
PHOSPHORUS: 3.7 mg/dL (ref 2.0–4.5)

## 2020-11-06 LAB — COMPREHENSIVE METABOLIC PANEL
ALBUMIN: 4.3 g/dL (ref 3.5–5.0)
ALK PHOSPHATASE: 81 U/L (ref 25–110)
ALT: 28 U/L (ref 7–56)
ANION GAP: 13 K/UL — ABNORMAL HIGH (ref 3–12)
AST: 22 U/L (ref 7–40)
CALCIUM: 9.8 mg/dL — ABNORMAL HIGH (ref 8.5–10.6)
CHLORIDE: 103 MMOL/L (ref 98–110)
CO2: 23 MMOL/L (ref 21–30)
EGFR: >60 mL/min (ref >60)
SODIUM: 139 MMOL/L — ABNORMAL HIGH (ref 137–147)
TOTAL BILIRUBIN: 0.7 mg/dL (ref 0.3–1.2)
TOTAL PROTEIN: 7.6 g/dL (ref 6.0–8.0)

## 2020-11-06 LAB — POC GLUCOSE
POC GLUCOSE: 134 mg/dL — ABNORMAL HIGH (ref 70–100)
POC GLUCOSE: 150 mg/dL — ABNORMAL HIGH (ref 70–100)

## 2020-11-06 LAB — MAGNESIUM
MAGNESIUM: 2 mg/dL (ref 1.6–2.6)
MAGNESIUM: 2 mg/dL (ref 1.6–2.6)

## 2020-11-06 LAB — COVID-19 (SARS-COV-2) PCR

## 2020-11-06 LAB — CBC AND DIFF
ABSOLUTE BASO COUNT: 0 K/UL (ref 0–0.20)
ABSOLUTE EOS COUNT: 0.1 K/UL (ref 0–0.45)
ABSOLUTE MONO COUNT: 0.6 K/UL (ref 0–0.80)
MDW (MONOCYTE DISTRIBUTION WIDTH): 18 (ref <20.7)
WBC COUNT: 8.7 K/UL (ref 4.5–11.0)

## 2020-11-06 LAB — INFLUENZA A/B AND RSV PCR
FLU A: NEGATIVE
FLU B: NEGATIVE
RSV: NEGATIVE

## 2020-11-06 LAB — POC LACTATE
LACTIC ACID POC: 2.7 MMOL/L — ABNORMAL HIGH (ref 0.5–2.0)
LACTIC ACID POC: 1.9 MMOL/L (ref 0.5–2.0)

## 2020-11-06 LAB — LIPASE: LIPASE: 13 U/L — ABNORMAL LOW (ref 11–82)

## 2020-11-06 LAB — TROPONIN-I: TROPONIN I: 0 ng/mL (ref 0.0–0.05)

## 2020-11-06 MED ORDER — LACTATED RINGERS IV SOLP
1000 mL | Freq: Once | INTRAVENOUS | 0 refills | Status: CP
Start: 2020-11-06 — End: ?
  Administered 2020-11-06: 22:00:00 1000 mL via INTRAVENOUS

## 2020-11-06 MED ORDER — SODIUM CHLORIDE 0.9 % IV SOLP
1000 mL | Freq: Once | INTRAVENOUS | 0 refills | Status: CP
Start: 2020-11-06 — End: ?
  Administered 2020-11-06: 16:00:00 1000 mL via INTRAVENOUS

## 2020-11-06 MED ORDER — PROCHLORPERAZINE MALEATE 10 MG PO TAB
10 mg | ORAL | 0 refills | Status: AC | PRN
Start: 2020-11-06 — End: ?

## 2020-11-06 MED ORDER — PROCHLORPERAZINE EDISYLATE 5 MG/ML IJ SOLN
10 mg | INTRAVENOUS | 0 refills | Status: AC | PRN
Start: 2020-11-06 — End: ?
  Administered 2020-11-06: 22:00:00 10 mg via INTRAVENOUS

## 2020-11-06 MED ORDER — TRAZODONE 100 MG PO TAB
100 mg | Freq: Every evening | ORAL | 0 refills | Status: AC | PRN
Start: 2020-11-06 — End: ?
  Administered 2020-11-07: 03:00:00 100 mg via ORAL

## 2020-11-06 MED ORDER — POTASSIUM CHLORIDE IN WATER 10 MEQ/50 ML IV PGBK
10 meq | INTRAVENOUS | 0 refills | Status: CP
Start: 2020-11-06 — End: ?
  Administered 2020-11-06: 20:00:00 10 meq via INTRAVENOUS

## 2020-11-06 MED ORDER — MORPHINE 2 MG/ML IV SYRG
2-4 mg | INTRAVENOUS | 0 refills | Status: DC | PRN
Start: 2020-11-06 — End: 2020-11-06
  Administered 2020-11-06 (×2): 4 mg via INTRAVENOUS

## 2020-11-06 MED ORDER — POLYETHYLENE GLYCOL 3350 17 GRAM PO PWPK
1 | Freq: Every day | ORAL | 0 refills | Status: AC | PRN
Start: 2020-11-06 — End: ?

## 2020-11-06 MED ORDER — FENTANYL CITRATE (PF) 50 MCG/ML IJ SOLN
25 ug | INTRAVENOUS | 0 refills | Status: DC | PRN
Start: 2020-11-06 — End: 2020-11-06
  Administered 2020-11-06: 16:00:00 25 ug via INTRAVENOUS

## 2020-11-06 MED ORDER — ONDANSETRON HCL (PF) 4 MG/2 ML IJ SOLN
4 mg | INTRAVENOUS | 0 refills | Status: AC | PRN
Start: 2020-11-06 — End: ?
  Administered 2020-11-06 – 2020-11-07 (×5): 4 mg via INTRAVENOUS

## 2020-11-06 MED ORDER — POTASSIUM CHLORIDE IN WATER 10 MEQ/50 ML IV PGBK
10 meq | INTRAVENOUS | 0 refills | Status: CP
Start: 2020-11-06 — End: ?
  Administered 2020-11-06: 22:00:00 10 meq via INTRAVENOUS

## 2020-11-06 MED ORDER — VENLAFAXINE 150 MG PO CP24
150 mg | Freq: Every day | ORAL | 0 refills | Status: AC
Start: 2020-11-06 — End: ?
  Administered 2020-11-07: 14:00:00 150 mg via ORAL

## 2020-11-06 MED ORDER — SOD CHLOR-BICARB-SQUEEZ BOTTLE SINI PKDV
1 | Freq: Two times a day (BID) | NASAL | 0 refills | Status: AC
Start: 2020-11-06 — End: ?
  Administered 2020-11-07: 17:00:00 1 via NASAL

## 2020-11-06 MED ORDER — LORAZEPAM 2 MG/ML IJ SOLN
1 mg | Freq: Once | INTRAVENOUS | 0 refills | Status: CP
Start: 2020-11-06 — End: ?
  Administered 2020-11-06: 19:00:00 1 mg via INTRAVENOUS

## 2020-11-06 MED ORDER — MORPHINE 2 MG/ML IV SYRG
1-2 mg | INTRAVENOUS | 0 refills | Status: AC | PRN
Start: 2020-11-06 — End: ?
  Administered 2020-11-07 (×3): 2 mg via INTRAVENOUS

## 2020-11-06 MED ORDER — POTASSIUM CHLORIDE IN WATER 10 MEQ/50 ML IV PGBK
10 meq | INTRAVENOUS | 0 refills | Status: CP
Start: 2020-11-06 — End: ?
  Administered 2020-11-07: 10 meq via INTRAVENOUS

## 2020-11-06 MED ORDER — POTASSIUM CHLORIDE IN WATER 10 MEQ/50 ML IV PGBK
10 meq | INTRAVENOUS | 0 refills | Status: CP
Start: 2020-11-06 — End: ?
  Administered 2020-11-07: 03:00:00 10 meq via INTRAVENOUS

## 2020-11-06 MED ORDER — PANTOPRAZOLE 40 MG IV SOLR
40 mg | Freq: Two times a day (BID) | INTRAVENOUS | 0 refills | Status: AC
Start: 2020-11-06 — End: ?
  Administered 2020-11-07 (×2): 40 mg via INTRAVENOUS

## 2020-11-06 MED ORDER — SODIUM CHLORIDE 0.9 % IV SOLP
500 mL | Freq: Once | INTRAVENOUS | 0 refills | Status: CP
Start: 2020-11-06 — End: ?

## 2020-11-06 MED ORDER — ENOXAPARIN 40 MG/0.4 ML SC SYRG
40 mg | Freq: Every day | SUBCUTANEOUS | 0 refills | Status: AC
Start: 2020-11-06 — End: ?
  Administered 2020-11-07: 01:00:00 40 mg via SUBCUTANEOUS

## 2020-11-06 MED ORDER — ALBUTEROL SULFATE 90 MCG/ACTUATION IN HFAA
2 | RESPIRATORY_TRACT | 0 refills | Status: AC | PRN
Start: 2020-11-06 — End: ?

## 2020-11-06 MED ORDER — INSULIN ASPART 100 UNIT/ML SC FLEXPEN
0-6 [IU] | Freq: Before meals | SUBCUTANEOUS | 0 refills | Status: AC
Start: 2020-11-06 — End: ?

## 2020-11-06 MED ORDER — ACETAMINOPHEN 325 MG PO TAB
650 mg | ORAL | 0 refills | Status: AC | PRN
Start: 2020-11-06 — End: ?
  Administered 2020-11-07: 19:00:00 650 mg via ORAL

## 2020-11-06 MED ADMIN — SODIUM CHLORIDE 0.9 % IV SOLP [27838]: 500 mL | INTRAVENOUS | @ 20:00:00 | Stop: 2020-11-06 | NDC 00338004903

## 2020-11-06 NOTE — Telephone Encounter
Attempted to contact patient, left message to call office.  Portal message sent.

## 2020-11-06 NOTE — ED Provider Notes
Erin Sanchez is a 60 y.o. female.    Chief Complaint:  Chief Complaint   Patient presents with   ? Abdominal pain   ? Vomiting       History of Present Illness:  32 yoF w/ ho DM, cirrhosis, hiatal hernia p/w abd pain, nausea. Patient reports:  :Last few days not feeling well  :N/V; difficulty vomiting due to flap in esophagus  :+Hiatal hernia  :Reminiscent of previous episodes of flare of hernia  :Following with Dr. Jiles Prows here at [facility]  :No fever  :No sick contacts          Review of Systems:  Review of Systems   Constitutional: Negative for unexpected weight change.   HENT: Negative for voice change.    Eyes: Negative for visual disturbance.   Respiratory: Positive for chest tightness. Negative for apnea.    Cardiovascular: Positive for chest pain. Negative for leg swelling.   Gastrointestinal: Positive for abdominal distention and abdominal pain.   Endocrine: Negative for cold intolerance.   Genitourinary: Negative for dyspareunia.   Musculoskeletal: Negative for joint swelling.   Skin: Negative for rash.   Allergic/Immunologic: Negative for immunocompromised state.   Neurological: Negative for speech difficulty.   Hematological: Negative for adenopathy.   Psychiatric/Behavioral: The patient is not hyperactive.        Allergies:  Codeine and Ciprofloxacin    Past Medical History:  Medical History:   Diagnosis Date   ? Anxiety disorder    ? Cirrhosis (HCC)    ? Diabetes (HCC)    ? Dizziness    ? GERD (gastroesophageal reflux disease)    ? Hearing loss    ? Hepatitis C     per labs on 02/15/2020 Hep C negative.      ? Hepatitis C 2011   ? History of hypertension    ? Liver disease     HEP C -SPLEEN   ? Peptic ulcer    ? Somatic dysfunction of thoracic region    ? Unspecified deficiency anemia        Past Surgical History:  Surgical History:   Procedure Laterality Date   ? HERNIA REPAIR  1999   ? HX ENDOSCOPY  2012   ? COLONOSCOPY  2012   ? Colonoscopy N/A 04/02/2020    Performed by Samuel [facility]ter[facility] at [facility]   ? ESOPHAGOGASTRODUODENOSCOPY WITH BIOPSY - FLEXIBLE N/A 04/02/2020    Performed by Samuel Jester, [facility] at [facility] OR   ? ESOPHAGUS SURGERY     ? HX SHOULDER REPLACEMENT      right   ? KNEE REPLACEMENT      left       Pertinent medical/surgical history reviewed    Social History:  Social History     Tobacco Use   ? Smoking status: Former Smoker     Types: Cigarettes     Quit date: 03/09/1998     Years since quitting: 22.6   ? Smokeless tobacco: Never Used   Substance Use Topics   ? Alcohol use: Yes     Alcohol/week: 2.5 standard drinks     Types: 3 Standard drinks or equivalent per week     Comment: socially   ? Drug use: No     Social History     Substance and Sexual Activity   Drug Use No             Family History:  Family History  Problem Relation Age of Onset   ? Heart Attack Mother    ? Cancer Mother         Lung   ? Stroke Father    ? Other Father         Heart Disease   ? Celiac Disease Neg Hx    ? Cirrhosis Neg Hx    ? Cancer-Colon Neg Hx    ? Colon Polyps Neg Hx    ? Crohn's Disease Neg Hx    ? Esophageal Cancer Neg Hx    ? Cystic Fibrosis Neg Hx    ? Hemochromatosis Neg Hx    ? Inflammatory Bowel Disease Neg Hx    ? Irritable Bowel Disease Neg Hx    ? Liver Disease Neg Hx    ? Liver Cancer Neg Hx    ? Rectal Cancer Neg Hx    ? Stomach Cancer Neg Hx    ? Ulcerative Colitis Neg Hx    ? Wilson's Disease Neg Hx    ? Melanoma Neg Hx        Vitals:  ED Vitals    Date and Time T BP P RR SPO2P SPO2 User   11/06/20 1344 -- 154/91 93 27 PER MINUTE -- -- KA   11/06/20 1230 -- 143/93 85 14 PER MINUTE 85 92 % KA   11/06/20 1150 -- 153/97 82 25 PER MINUTE 81 99 % KA   11/06/20 1030 36.1 ?C (97 ?F) 148/128 108 -- -- 99 % NH          Physical Exam:  Physical Exam  Constitutional:       Appearance: She is well-developed.      Comments: Uncomfortable     HENT:      Head: Normocephalic.      Mouth/Throat:      Mouth: Mucous membranes are dry.   Eyes:      Extraocular Movements: Extraocular movements intact.   Cardiovascular:      Comments: Normal peripheral perfusion    Pulmonary:      Effort: No respiratory distress.   Abdominal:      General: There is no distension.   Musculoskeletal:         General: No deformity.      Cervical back: Neck supple.   Skin:     General: Skin is warm.      Findings: No rash.   Neurological:      General: No focal deficit present.      Mental Status: She is alert.   Psychiatric:         Mood and Affect: Mood normal.         Laboratory Results:  Labs Reviewed   CBC AND DIFF - Abnormal       Result Value Ref Range Status    White Blood Cells 8.7  4.5 - 11.0 K/UL Final    RBC 5.04 (*) 4.0 - 5.0 M/UL Final    Hemoglobin 15.0  12.0 - 15.0 GM/DL Final    Hematocrit 16.1  36 - 45 % Final    MCV 86.4  80 - 100 FL Final    MCH 29.8  26 - 34 PG Final    MCHC 34.4  32.0 - 36.0 G/DL Final    RDW 09.6 (*) 11 - 15 % Final    Platelet Count 231  150 - 400 K/UL Final    MPV 8.4  7 - 11 FL Final  Neutrophils 63  41 - 77 % Final    Lymphocytes 26  24 - 44 % Final    Monocytes 8  4 - 12 % Final    Eosinophils 2  0 - 5 % Final    Basophils 1  0 - 2 % Final    Absolute Neutrophil Count 5.49  1.8 - 7.0 K/UL Final    Absolute Lymph Count 2.30  1.0 - 4.8 K/UL Final    Absolute Monocyte Count 0.68  0 - 0.80 K/UL Final    Absolute Eosinophil Count 0.16  0 - 0.45 K/UL Final    Absolute Basophil Count 0.09  0 - 0.20 K/UL Final    MDW (Monocyte Distribution Width) 18.2  <20.7 Final   COMPREHENSIVE METABOLIC PANEL - Abnormal    Sodium 139  137 - 147 MMOL/L Final    Potassium 3.4 (*) 3.5 - 5.1 MMOL/L Final    Chloride 103  98 - 110 MMOL/L Final    Glucose 134 (*) 70 - 100 MG/DL Final    Blood Urea Nitrogen 9  7 - 25 MG/DL Final    Creatinine 1.61  0.4 - 1.00 MG/DL Final    Calcium 9.8  8.5 - 10.6 MG/DL Final    Total Protein 7.6  6.0 - 8.0 G/DL Final    Total Bilirubin 0.7  0.3 - 1.2 MG/DL Final    Albumin 4.3  3.5 - 5.0 G/DL Final    Alk Phosphatase 81  25 - 110 U/L Final    AST (SGOT) 22  7 - 40 U/L Final CO2 23  21 - 30 MMOL/L Final    ALT (SGPT) 28  7 - 56 U/L Final    Anion Gap 13 (*) 3 - 12 Final    eGFR >60  >60 mL/min Final   POC LACTATE - Abnormal    LACTIC ACID POC 2.7 (*) 0.5 - 2.0 MMOL/L Final   LIPASE    Lipase 13  11 - 82 U/L Final   PHOSPHORUS    Phosphorus 3.3  2.0 - 4.5 MG/DL Final   MAGNESIUM    Magnesium 2.0  1.6 - 2.6 mg/dL Final   TROPONIN-I    Troponin-I 0.01  0.0 - 0.05 NG/ML Final   POC LACTATE    LACTIC ACID POC 1.9  0.5 - 2.0 MMOL/L Final   UA REFLEX LABEL   URINALYSIS MICROSCOPIC REFLEX TO CULTURE   URINALYSIS DIPSTICK REFLEX TO CULTURE   POC LACTATE   POC LACTATE              EKG:    @1107  NSR, LVH, QTc 459, no STEMI; compared to previous    ED Course:  Abd pain, n/v, cp  Reminiscent of previous episodes of hernia flare  Labs  CT  IVF, analgesia    1427  Labs fairly reassuring  CT stable  Pt with persistent n/v, abd pain  Given hx and persistent symptoms, will admit for serial exams, symptom control, ?GI, ?CTS, ?bariatrics?; defer to inpatient team    Indication for imaging: Abd pain, CP in setting of hiatal hernia   A radiologic exam with iodinated contrast is usually the preferred study, but due to global national and international supply shortage of iodinated contrast, we are recommending an alternative test for this imaging need.  In the event the diagnostic observations needed cannot be adequately imaged using this modality, we will consider/discuss additional modalities with radiology that might add additional insight.  Coding    Facility Administered Meds:  Medications   ondansetron (ZOFRAN) injection 4 mg (4 mg Intravenous Given 11/06/20 1102)   fentaNYL citrate PF (SUBLIMAZE) injection 25 mcg (25 mcg Intravenous Given 11/06/20 1102)   morphine injection 2-4 mg (4 mg Intravenous Given 11/06/20 1321)   sodium chloride 0.9 %   infusion (0 mL Intravenous Infusion Stopped 11/06/20 1315)   LORazepam (ATIVAN) injection 1 mg (1 mg Intravenous Given 11/06/20 1341)         Clinical Impression:  Clinical Impression   Epigastric pain   Nausea and vomiting, unspecified vomiting type       Disposition/Follow up  ED Disposition     ED Disposition    Admit        No follow-up provider specified.    Medications:  New Prescriptions    No medications on file       Procedure Notes:  Procedures      Attestation / Supervision:  I personally performed the E/M including history, physical exam, and MDM.      Lendell Caprice, MD

## 2020-11-06 NOTE — Telephone Encounter
Patient called office.  Patient states that she is having severe abdominal pain, nausea, and dry heaves.  Patient states that she has a "flap" that prevents her from vomiting, but she is not able to take anything in without these symptoms.  Patient has history of incarcerated hernia that required emergent treatment in 2019.  Patient instructed to go to the ER for evaluation/treatment due to severity of symptoms.   Patient voices understanding and agrees.

## 2020-11-06 NOTE — H&P (View-Only)
Admission History and Physical Examination    Erin Sanchez  Admission Date:  11/06/2020                     Assessment/Plan:    Principal Problem:    Intractable nausea and vomiting    Ms. 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  She is being admitted under observation status to the internal medicine service for IV pain control, IV fluids, and antiemetics    Intractable nausea, dry heaving and abdominal pain  Lactic acidosis, resolved  -Reports onset of symptoms Sunday night with progressive worsening; family member w/ similar but more mild GI symptoms. Patinet initially had diarrhea which has since resolved, but nausea has persisted. She is unable to vomit 2/2 due prior fundoplication surgery but has violent dry heaving witnessed in the ED. She also c/o substernal chest and epigastric pain.   -On presentation: Hypertensive with blood pressure of 153/97 mmHg, tachycardic with a heart rate of 108, afebrile.  Lactic acid elevated at 2.7 (improved to 1.9 after IV fluids).  Labs notable for normal white blood cell count.  Hemoglobin of 15, mild hypokalemia of 3.4.  Normal BUN and creatinine.  -CT chest abdomen and pelvis noncontrast shows an unchanged large hiatal hernia, coronary artery disease,  Abdomen and pelvis shows cirrhosis with portal hypertension.  Mild pancolonic diverticulosis.  Plan  > Zofran 4 mg IV q4h PRN for nausea or Compazine 10 mg q6h PRN IV   > 1 liter LR at 56ml/hr  > Clear liquid diet, advance as tolerated  > IV PPI BID for now with nausea  > Morphine 1-2 mg q4h PRN for pain  > Aspiration precautions    Mild hypoxia  Long Haul COVID   - Placed on 2 lpm of O2 in the ED. Able to weaned to room air prior when admitted to the floor.   - Reports she had COVID in December and has bene undergoing pulmonary rehabilitation since.   - CT chest w/ enlarged main pulmonary artery likely due to pulmonary hypertension, ill-defined patchy areas of groundglass type opacities scattered throughout both lungs that most likely represent fibrosis.  Acute pneumonitis felt to be less likely.    Plan  > Monitor O2 sats  > Incentive spirometry  > Albuterol PRN    Left Breast Nodule  - On CT chest on admit: Small 10 mm nodule within the left breast this is incompletely evaluated by CT and diagnostic mammogram is recommended if not already obtained.   - Discussed with patient, she is not up to date on mammo  Plan  > Diagnostic mammogram of left breast ordered for 5/19    Cirrhosis secondary to Hepatitis C, well compensated  > Monitor fluid status closely    HTN  - BP above goal at time of admit, likely due to pain and active dry heaving  - PTA valsartan and chlorthalidone  Plan  > Hold BP meds with risk for AKI due to dehydration w/ poor PO intake and nausea    Hypokalemia, mild  - K+ 3.4  Plan  > IV Supplementation ordered    Diabetes Mellitus, Type 2  - PTA  metformin  Plan  > Check hemoglobin A1c  > Aspart low dose correction factor 4x/day    Fluids/Electrolytes and Nutrition: LR @ 75 ml/hr, reviewed electrolytes: K+ supplemented, Clear liquid diet  DVT prophylaxis: enoxaparin  Lines/Drains: peripheral IV  Code status :  Full code  _________________________________________________________________________________    Chief Complaint:  Flare up of my hernia      History of Present Illness: Erin Sanchez is a 60 y.o. female with past medical history of liver cirrhosis secondary to hepatitis C, type 2 diabetes, hiatal hernia (status post open repair with fundoplication in 1999), incarcerated incisional hernia near the umbilicus, and long-haul COVID syndrome who presents with complaints of intractable nausea, dry heaving and epigastric pain.  She is being admitted under observation status to the internal medicine service for IV pain control, IV fluids, and antiemetics    Patient reports that on Sunday night she began having diarrhea, dry heaving and epigastric pain  She states this has progressively worsened over the last several days.  She now reports having substernal chest discomfort which she attributes to her hernia and ongoing epigastric pain which she rates as 8 out of 10. She is actively dry heaving throughout the interview.  She reports that a family member recently had similar but mild symptoms.  She states that this seems to be a flareup of her hernia.  She reports that she plans to have flap surgery for her hernia with Dr. Velva Harman, but she is going through pulmonary rehab first secondary to long haul COVID syndrome.  She reports diarrhea has resolved.  She states she has been unable to eat for the last 3 days.  She did try to eat some oatmeal at 3 am this morning which caused her to have worsened dry heaves.    She denies any shortness of breath.  But she reports postnasal drip and chronic nocturnal cough since COVID.      Medical History:   Diagnosis Date   ? Anxiety disorder    ? Cirrhosis (HCC)    ? Diabetes (HCC)    ? Dizziness    ? GERD (gastroesophageal reflux disease)    ? Hearing loss    ? Hepatitis C     per labs on 02/15/2020 Hep C negative.      ? Hepatitis C 2011   ? History of hypertension    ? Liver disease     HEP C -SPLEEN   ? Peptic ulcer    ? Somatic dysfunction of thoracic region    ? Unspecified deficiency anemia      Surgical History:   Procedure Laterality Date   ? HERNIA REPAIR  1999   ? HX ENDOSCOPY  2012   ? COLONOSCOPY  2012   ? Colonoscopy N/A 04/02/2020    Performed by Samuel [facility]ter[facility] at [facility]   ? ESOPHAGOGASTRODUODENOSCOPY WITH BIOPSY - FLEXIBLE N/A 04/02/2020    Performed by Samuel Jester, MD at [facility] OR   ? ESOPHAGUS SURGERY     ? HX SHOULDER REPLACEMENT      right   ? KNEE REPLACEMENT      left     Family History   Problem Relation Age of Onset   ? Heart Attack Mother    ? Cancer Mother         Lung   ? Stroke Father    ? Other Father         Heart Disease   ?  Celiac Disease Neg Hx    ? Cirrhosis Neg Hx    ? Cancer-Colon Neg Hx    ? Colon Polyps Neg Hx    ? Crohn's Disease Neg Hx    ? Esophageal Cancer Neg Hx    ? Cystic Fibrosis Neg Hx    ? Hemochromatosis Neg Hx    ? Inflammatory Bowel Disease Neg Hx    ? Irritable Bowel Disease Neg Hx    ? Liver Disease Neg Hx    ? Liver Cancer Neg Hx    ? Rectal Cancer Neg Hx    ? Stomach Cancer Neg Hx    ? Ulcerative Colitis Neg Hx    ? Wilson's Disease Neg Hx    ? Melanoma Neg Hx      Social History     Socioeconomic History   ? Marital status: Widowed   Occupational History     Employer: [employer]   Tobacco Use   ? Smoking status: Former Smoker     Types: Cigarettes     Quit date: 03/09/1998     Years since quitting: 22.6   ? Smokeless tobacco: Never Used   Substance and Sexual Activity   ? Alcohol use: Yes     Alcohol/week: 2.5 standard drinks     Types: 3 Standard drinks or equivalent per week     Comment: socially   ? Drug use: No      Immunizations (includes history and patient reported):   There is no immunization history on file for this patient.        Allergies:  Codeine and Ciprofloxacin    Medications:  Medications Prior to Admission   Medication Sig   ? acetaminophen (TYLENOL) 500 mg tablet Take 500 mg by mouth twice daily. Max of 4,000 mg of acetaminophen in 24 hours.   ? albuterol sulfate (PROAIR HFA) 90 mcg/actuation HFA aerosol inhaler Inhale 2 puffs by mouth into the lungs daily as needed for Wheezing or Shortness of Breath. Shake well before use.   ? buPROPion XL (WELLBUTRIN XL) 150 mg tablet Take 150 mg by mouth.   ? chlorthalidone (HYGROTON) 50 mg tablet Take 50 mg by mouth.   ? cyclobenzaprine (FLEXERIL) 5 mg tablet TAKE 1-2 TABLETS (5-10 MG TOTAL) BY MOUTH 3 (THREE) TIMES A DAY AS NEEDED FOR MUSCLE SPASMS.   ? diclofenac sodium (VOLTAREN) 1 % topical gel Apply four g topically to affected area four times daily.   ? HYDROcodone/acetaminophen (NORCO) 5/325 mg tablet Take one tablet by mouth every 6 hours as needed for Pain   ? ibuprofen (ADVIL) 200 mg tablet Take 800 mg by mouth twice daily as needed for Pain. Take with food.   ? ibuprofen (MOTRIN) 800 mg tablet Take 800 mg by mouth twice daily. Take with food.   ? metformin HCl (METFORMIN PO) Take  by mouth.   ? metFORMIN-XR (GLUCOPHAGE XR) 500 mg extended release tablet Take 2 tablets by mouth daily.   ? nabumetone (RELAFEN) 750 mg tablet Take one tablet by mouth twice daily.   ? omeprazole DR (PRILOSEC) 40 mg capsule TAKE 1 CAPSULE BY MOUTH TWICE A DAY BEFORE MEALS   ? omeprazole DR (PRILOSEC) 40 mg capsule Take 1 capsule by mouth daily.   ? ondansetron (ZOFRAN) 4 mg tablet Take 4 mg by mouth every 8 hours as needed.   ? ondansetron HCL (ZOFRAN) 4 mg tablet Take 4-8 mg by mouth every 8 hours as needed.   ? other  medication 1 Dose daily. Pt. Believes it is called Zetima, an anti-depressant. Dose unknown   ? peg-electrolyte solution (NULYTELY) 420 gram oral solution Split dose by mouth as directed by GI office   ? potassium chloride SR (K-DUR) 20 mEq tablet Take 20 mEq by mouth twice daily.   ? promethazine (PHENERGAN) 25 mg tablet Take one tablet by mouth every 4 hours.   ? traZODone (DESYREL) 50 mg tablet Take 25-100 mg by mouth daily as needed.   ? triazolam (HALCION) 0.25 mg tablet Take 0.25 mg by mouth twice daily.   ? valsartan (DIOVAN) 80 mg tablet Take 80 mg by mouth daily.   ? venlafaxine XR (EFFEXOR XR) 150 mg capsule Take 150 mg by mouth daily.       Review of Systems:  Constitutional: denies fever.  Reports chills with alternating hot flashes.  Eyes: denies double vision, sudden vision loss  ENT & mouth: denies ringing in the ears, sudden hearing loss. Positive for left-sided nasal pain and congestion, postnasal drip.  Cardiovascular: denies chest pain, palpitations  Respiratory: Denies shortness of breath.  Positive for nocturnal cough.  GI: Positive for nausea, dry heaves, and epigastric pain  GU: denies increased urinary frequency, dysuria  Musculoskeletal: denies joint pain, weakness  Skin: denies new skin rash or skin lesions  Neurological:denies dizziness, loss of consciousness   Psychiatric: denies depression, suicidal ideation  Endocrine: denies heat or cold intolerance  Hematology/lymphatic: denies abnormal bleeding, bruising    Physical Exam:  Vital Signs: Last Filed In 24 Hours Vital Signs: 24 Hour Range   BP: 155/98 (05/18 1935)  Temp: 36.8 ?C (98.2 ?F) (05/18 1935)  Pulse: 101 (05/18 1935)  Respirations: 18 PER MINUTE (05/18 1935)  SpO2: 95 % (05/18 1935)  O2 Device: None (Room air) (05/18 1935)  SpO2 Pulse: 86 (05/18 1600)  Height: 167.6 cm (5' 6) (05/18 1655) BP: (143-155)/(86-128)   Temp:  [36.1 ?C (97 ?F)-36.8 ?C (98.2 ?F)]   Pulse:  [82-108]   Respirations:  [14 PER MINUTE-27 PER MINUTE]   SpO2:  [92 %-99 %]   O2 Device: None (Room air)   Intensity Pain Scale (Self Report): 8 (11/06/20 2019)        Constitutional:  Alert, cooperative, moderately distressed with active dry heaving, appears stated age.  Eyes: PEERL, clear conjunctivae, no scleral icterus.  Ears, Nose, Mouth and Throat: Lips, mucosa and tongue normal.  Teeth and gums normal.  Neck:  Supple, symmetrical.   Cardiovascular:  Regular rate and rhythm, S1, S2 normal, no murmur, click rub or gallop. No lower extremity edema. Radial pulses 2+ bilaterally.   Respiratory: Breathing comfortably on 2 lpm nasal cannula. Lungs clear to auscultation. No wheezing or rhonchi.  Gastrointestinal: Abdomen with mild epigastric tenderness. Bowels sound normal. No masses. No organomegaly.   Genitourinary: Deferred.   Musculoskeletal: Grossly normal range of motion. Muscle tone and bulk normal.   Skin: No ecchymosis or cyanosis present. Vitiligo present.   Neurologic: Alert and oriented to person, place and time. No focal neurologic deficit.   Psychiatric: Cooperative. Normal mood and affect.       Lab/Radiology/Other Diagnostic Tests:  24-hour labs:    Results for orders placed or performed during the hospital encounter of 11/06/20 (from the past 24 hour(s))   CBC AND DIFF    Collection Time: 11/06/20 11:00 AM   Result Value Ref Range    White Blood Cells 8.7 4.5 - 11.0 K/UL    RBC 5.04 (H) 4.0 -  5.0 M/UL    Hemoglobin 15.0 12.0 - 15.0 GM/DL    Hematocrit 04.5 36 - 45 %    MCV 86.4 80 - 100 FL    MCH 29.8 26 - 34 PG    MCHC 34.4 32.0 - 36.0 G/DL    RDW 40.9 (H) 11 - 15 %    Platelet Count 231 150 - 400 K/UL    MPV 8.4 7 - 11 FL    Neutrophils 63 41 - 77 %    Lymphocytes 26 24 - 44 %    Monocytes 8 4 - 12 %    Eosinophils 2 0 - 5 %    Basophils 1 0 - 2 %    Absolute Neutrophil Count 5.49 1.8 - 7.0 K/UL    Absolute Lymph Count 2.30 1.0 - 4.8 K/UL    Absolute Monocyte Count 0.68 0 - 0.80 K/UL    Absolute Eosinophil Count 0.16 0 - 0.45 K/UL    Absolute Basophil Count 0.09 0 - 0.20 K/UL    MDW (Monocyte Distribution Width) 18.2 <20.7   COMPREHENSIVE METABOLIC PANEL    Collection Time: 11/06/20 11:00 AM   Result Value Ref Range    Sodium 139 137 - 147 MMOL/L    Potassium 3.4 (L) 3.5 - 5.1 MMOL/L    Chloride 103 98 - 110 MMOL/L    Glucose 134 (H) 70 - 100 MG/DL    Blood Urea Nitrogen 9 7 - 25 MG/DL    Creatinine 8.11 0.4 - 1.00 MG/DL    Calcium 9.8 8.5 - 91.4 MG/DL    Total Protein 7.6 6.0 - 8.0 G/DL    Total Bilirubin 0.7 0.3 - 1.2 MG/DL    Albumin 4.3 3.5 - 5.0 G/DL    Alk Phosphatase 81 25 - 110 U/L    AST (SGOT) 22 7 - 40 U/L    CO2 23 21 - 30 MMOL/L    ALT (SGPT) 28 7 - 56 U/L    Anion Gap 13 (H) 3 - 12    eGFR >60 >60 mL/min   LIPASE    Collection Time: 11/06/20 11:00 AM   Result Value Ref Range    Lipase 13 11 - 82 U/L   PHOSPHORUS    Collection Time: 11/06/20 11:00 AM   Result Value Ref Range    Phosphorus 3.3 2.0 - 4.5 MG/DL   MAGNESIUM    Collection Time: 11/06/20 11:00 AM   Result Value Ref Range    Magnesium 2.0 1.6 - 2.6 mg/dL   TROPONIN-I    Collection Time: 11/06/20 11:00 AM   Result Value Ref Range    Troponin-I 0.01 0.0 - 0.05 NG/ML   POC LACTATE    Collection Time: 11/06/20 11:06 AM   Result Value Ref Range    LACTIC ACID POC 2.7 (H) 0.5 - 2.0 MMOL/L   POC LACTATE    Collection Time: 11/06/20 12:01 PM   Result Value Ref Range    LACTIC ACID POC 1.9 0.5 - 2.0 MMOL/L   COVID-19 (SARS-COV-2) PCR    Collection Time: 11/06/20  2:34 PM    Specimen: Nasopharyngeal; Flocked Swab   Result Value Ref Range    COVID-19 (SARS-CoV-2) PCR Source FLOCKED SWAB  NASOPHARYNGEAL       COVID-19 (SARS-CoV-2) PCR NOT DETECTED DN-NOT DETECTED   INFLUENZA A/B AND RSV PCR    Collection Time: 11/06/20  2:34 PM    Specimen: Nasopharyngeal; Flocked Swab   Result Value Ref Range  Influenza A Virus NEG NEG-NEG    Influenza B Virus NEG NEG-NEG    RSV NEG NEG-NEG   POC GLUCOSE    Collection Time: 11/06/20  5:35 PM   Result Value Ref Range    Glucose, POC 134 (H) 70 - 100 MG/DL   MAGNESIUM    Collection Time: 11/06/20  5:51 PM   Result Value Ref Range    Magnesium 2.0 1.6 - 2.6 mg/dL   PHOSPHORUS    Collection Time: 11/06/20  5:51 PM   Result Value Ref Range    Phosphorus 3.7 2.0 - 4.5 MG/DL          CT CHEST WO CONTRAST  Narrative: CT CHEST, ABDOMEN, AND PELVIS    Clinical Indication: Abdominal pain. Vomiting. History of a hiatal hernia.    Technique: Multiple contiguous axial images were obtained through the chest, abdomen, and pelvis without IV contrast material. Post processing coronal and sagittal reconstruction images were made from the axial images.      IV contrast: None.  Bowel contrast:  None    Comparison: CT abdomen May 15, 2020. MRI lumbar spine October 16, 2019. CT abdomen/pelvis May 24, 2018 and January 27, 2011.    CHEST FINDINGS:    Evaluation of the mediastinum and hila, including the vasculature and for lymphadenopathy, is limited without the use of IV contrast.    Lower Neck: Unremarkable    Axilla, Mediastinum, and Hila: No lymphadenopathy. Redemonstration of the large hiatal hernia.     Heart and Great Vessels: Normal size heart with at least moderate calcific coronary artery disease. Main pulmonary artery is mildly enlarged measuring up to 3.5 cm.    Airway, Lungs, and Pleura: Ill-defined, patchy areas of groundglass type opacity are scattered throughout both lungs. No pulmonary consolidation, pleural effusion, or pneumothorax.     Chest Wall and Osseous Structures: Moderate thoracic spondylosis. Prior right shoulder arthroplasty. 10 mm nodule is present in the upper outer quadrant of the left breast (series 301 image 51).    Abdomen and Pelvis Findings:    Limited evaluation without the use of IV contrast which includes the viscera and vasculature.    Liver and Biliary system: Cirrhosis. No calcified gallstones. Mild periportal lymphadenopathy is unchanged that is most likely reactive.     Spleen: Unchanged mild splenomegaly    Adrenal Glands and Kidneys: Unremarkable.    Pancreas and Retroperitoneum: Unremarkable.    Aorta and Major Vessels: Normal caliber abdominal aorta. Mild calcific atherosclerosis.     Bowel, Mesentery, and Peritoneal space: Bowel loops are normal in caliber. Mild pancolonic diverticulosis. No ascites or pneumoperitoneum.    Pelvis: Urinary bladder is minimally distended and unremarkable. Uterus is anteverted and atrophic. No pelvic lymphadenopathy.    Abdominal Wall and Osseous Structures: Moderate lumbar spondylosis. Tiny fat-containing umbilical hernia and a few additional tiny anterior abdominal wall hernias.  Impression: CHEST:  1. Unchanged large hiatal hernia  2. Coronary artery disease.  3. Enlarged main pulmonary artery that is likely due to pulmonary hypertension  4. Ill-defined, patchy areas of groundglass type opacities scattered throughout both lungs that most likely represents fibrosis. Acute pneumonitis is less likely.  5. Small 10 mm nodule within the left breast. This is incompletely evaluated by CT and diagnostic mammogram is recommended if not already obtained.    ABDOMEN AND PELVIS:  1. Cirrhosis and portal hypertension  2. Mild pancolonic diverticulosis      Finalized by Rosilyn Mings, M.D. on 11/06/2020 12:38 PM. Dictated by  Rosilyn Mings, M.D. on 11/06/2020 11:54 AM.  CT ABD/PELV WO CONTRAST  Narrative: CT CHEST, ABDOMEN, AND PELVIS    Clinical Indication: Abdominal pain. Vomiting. History of a hiatal hernia.    Technique: Multiple contiguous axial images were obtained through the chest, abdomen, and pelvis without IV contrast material. Post processing coronal and sagittal reconstruction images were made from the axial images.      IV contrast: None.  Bowel contrast:  None    Comparison: CT abdomen May 15, 2020. MRI lumbar spine October 16, 2019. CT abdomen/pelvis May 24, 2018 and January 27, 2011.    CHEST FINDINGS:    Evaluation of the mediastinum and hila, including the vasculature and for lymphadenopathy, is limited without the use of IV contrast.    Lower Neck: Unremarkable    Axilla, Mediastinum, and Hila: No lymphadenopathy. Redemonstration of the large hiatal hernia.     Heart and Great Vessels: Normal size heart with at least moderate calcific coronary artery disease. Main pulmonary artery is mildly enlarged measuring up to 3.5 cm.    Airway, Lungs, and Pleura: Ill-defined, patchy areas of groundglass type opacity are scattered throughout both lungs. No pulmonary consolidation, pleural effusion, or pneumothorax.     Chest Wall and Osseous Structures: Moderate thoracic spondylosis. Prior right shoulder arthroplasty. 10 mm nodule is present in the upper outer quadrant of the left breast (series 301 image 51).    Abdomen and Pelvis Findings:    Limited evaluation without the use of IV contrast which includes the viscera and vasculature.    Liver and Biliary system: Cirrhosis. No calcified gallstones. Mild periportal lymphadenopathy is unchanged that is most likely reactive.     Spleen: Unchanged mild splenomegaly    Adrenal Glands and Kidneys: Unremarkable.    Pancreas and Retroperitoneum: Unremarkable.    Aorta and Major Vessels: Normal caliber abdominal aorta. Mild calcific atherosclerosis.     Bowel, Mesentery, and Peritoneal space: Bowel loops are normal in caliber. Mild pancolonic diverticulosis. No ascites or pneumoperitoneum.    Pelvis: Urinary bladder is minimally distended and unremarkable. Uterus is anteverted and atrophic. No pelvic lymphadenopathy.    Abdominal Wall and Osseous Structures: Moderate lumbar spondylosis. Tiny fat-containing umbilical hernia and a few additional tiny anterior abdominal wall hernias.  Impression: CHEST:  1. Unchanged large hiatal hernia  2. Coronary artery disease.  3. Enlarged main pulmonary artery that is likely due to pulmonary hypertension  4. Ill-defined, patchy areas of groundglass type opacities scattered throughout both lungs that most likely represents fibrosis. Acute pneumonitis is less likely.  5. Small 10 mm nodule within the left breast. This is incompletely evaluated by CT and diagnostic mammogram is recommended if not already obtained.    ABDOMEN AND PELVIS:  1. Cirrhosis and portal hypertension  2. Mild pancolonic diverticulosis      Finalized by Rosilyn Mings, M.D. on 11/06/2020 12:38 PM. Dictated by Rosilyn Mings, M.D. on 11/06/2020 11:54 AM.        Jorje Guild, MD  Internal Medicine

## 2020-11-06 NOTE — ED Notes
Pt reports relief of pain post medication. Resting comfortably at this time. VSS. Call light within reach. Family at bedside. Awaiting results.

## 2020-11-06 NOTE — Telephone Encounter
Patient left message that her hernia is inflared and needing seen ASAP or if she should go to ER.

## 2020-11-06 NOTE — ED Notes
Report to Caroline, RN.

## 2020-11-06 NOTE — Progress Notes
RT Adult Assessment Note    NAME:Erin Sanchez             MRN: [id number]             DOB:1961/02/18          AGE: 60 y.o.  ADMISSION DATE: 11/06/2020             DAYS ADMITTED: LOS: 0 days    RT Treatment Plan:  Protocol Plan: Medications  Albuterol: MDI PRN    Protocol Plan: Procedures  PAP: Place a nursing order for "IS Q1h While Awake" for any of Lung Expansion indicators    Additional Comments:  Impressions of the patient: no signs of distress  Intervention(s)/outcome(s): assess patient  Patient education that was completed: MDI  Recommendations to the care team: n/a    Vital Signs:  Pulse: 84  RR: 20 PER MINUTE  SpO2: 99 %  O2 Device: None (Room air)  Liter Flow:    O2%:    Breath Sounds: Clear (Implies normal);Decreased  Respiratory Effort: Non-Labored

## 2020-11-07 ENCOUNTER — Observation Stay: Admit: 2020-11-07 | Discharge: 2020-11-07 | Payer: MEDICAID

## 2020-11-12 ENCOUNTER — Encounter: Admit: 2020-11-12 | Discharge: 2020-11-12 | Payer: MEDICAID

## 2020-11-12 ENCOUNTER — Ambulatory Visit: Admit: 2020-11-12 | Discharge: 2020-11-12 | Payer: MEDICAID

## 2020-11-12 DIAGNOSIS — M461 Sacroiliitis, not elsewhere classified: Secondary | ICD-10-CM

## 2020-11-12 DIAGNOSIS — K769 Liver disease, unspecified: Secondary | ICD-10-CM

## 2020-11-12 DIAGNOSIS — Z8679 Personal history of other diseases of the circulatory system: Secondary | ICD-10-CM

## 2020-11-12 DIAGNOSIS — B1920 Unspecified viral hepatitis C without hepatic coma: Secondary | ICD-10-CM

## 2020-11-12 DIAGNOSIS — D539 Nutritional anemia, unspecified: Secondary | ICD-10-CM

## 2020-11-12 DIAGNOSIS — M9902 Segmental and somatic dysfunction of thoracic region: Secondary | ICD-10-CM

## 2020-11-12 DIAGNOSIS — K219 Gastro-esophageal reflux disease without esophagitis: Secondary | ICD-10-CM

## 2020-11-12 DIAGNOSIS — M25559 Pain in unspecified hip: Secondary | ICD-10-CM

## 2020-11-12 DIAGNOSIS — K7460 Unspecified cirrhosis of liver: Secondary | ICD-10-CM

## 2020-11-12 DIAGNOSIS — F419 Anxiety disorder, unspecified: Secondary | ICD-10-CM

## 2020-11-12 DIAGNOSIS — K279 Peptic ulcer, site unspecified, unspecified as acute or chronic, without hemorrhage or perforation: Secondary | ICD-10-CM

## 2020-11-12 DIAGNOSIS — R42 Dizziness and giddiness: Secondary | ICD-10-CM

## 2020-11-12 DIAGNOSIS — E119 Type 2 diabetes mellitus without complications: Secondary | ICD-10-CM

## 2020-11-12 MED ORDER — IOHEXOL 300 MG IODINE/ML IV SOLN
2 mL | Freq: Once | 0 refills | Status: CP
Start: 2020-11-12 — End: ?

## 2020-11-12 MED ORDER — LIDOCAINE (PF) 10 MG/ML (1 %) IJ SOLN
2 mL | Freq: Once | INTRAMUSCULAR | 0 refills | Status: CP
Start: 2020-11-12 — End: ?

## 2020-11-12 MED ORDER — TRIAMCINOLONE ACETONIDE 40 MG/ML IJ SUSP
80 mg | Freq: Once | EPIDURAL | 0 refills | Status: CP
Start: 2020-11-12 — End: ?

## 2020-11-12 MED ORDER — BUPIVACAINE (PF) 0.25 % (2.5 MG/ML) IJ SOLN
8 mL | Freq: Once | INTRAMUSCULAR | 0 refills | Status: CP
Start: 2020-11-12 — End: ?

## 2020-11-12 NOTE — Progress Notes
[facility]  INTERVENTIONAL PAIN PROCEDURE HISTORY AND PHYSICAL    No chief complaint on file.      HISTORY OF PRESENT ILLNESS:  Erin Sanchez is a 60 y.o. year old female who presents for injection.  Denies fevers, chills, or recent hospitalizations.  Patient denies currently taking blood thinning medications.       Medical History:   Diagnosis Date   ? Anxiety disorder    ? Cirrhosis (HCC)    ? Diabetes (HCC)    ? Dizziness    ? GERD (gastroesophageal reflux disease)    ? Hearing loss    ? Hepatitis C     per labs on 02/15/2020 Hep C negative.      ? Hepatitis C 2011   ? History of hypertension    ? Liver disease     HEP C -SPLEEN   ? Peptic ulcer    ? Somatic dysfunction of thoracic region    ? Unspecified deficiency anemia        Surgical History:   Procedure Laterality Date   ? HERNIA REPAIR  1999   ? HX ENDOSCOPY  2012   ? COLONOSCOPY  2012   ? Colonoscopy N/A 04/02/2020    Performed by Samuel Jester, MD at [facility] OR   ? ESOPHAGOGASTRODUODENOSCOPY WITH BIOPSY - FLEXIBLE N/A 04/02/2020    Performed by Samuel Jester, MD at [facility] OR   ? ESOPHAGUS SURGERY     ? HX SHOULDER REPLACEMENT      right   ? KNEE REPLACEMENT      left       family history includes Cancer in her mother; Heart Attack in her mother; Other in her father; Stroke in her father.    Social History     Socioeconomic History   ? Marital status: Widowed   Occupational History     Employer: [employer]   Tobacco Use   ? Smoking status: Former Smoker     Types: Cigarettes     Quit date: 03/09/1998     Years since quitting: 22.6   ? Smokeless tobacco: Never Used   Substance and Sexual Activity   ? Alcohol use: Yes     Alcohol/week: 2.5 standard drinks     Types: 3 Standard drinks or equivalent per week     Comment: socially   ? Drug use: No       Allergies   Allergen Reactions   ? Codeine ITCHING   ? Ciprofloxacin UNKNOWN     Used for an eye infection, caused patient to be very ill.       Vitals:    11/12/20 1022   BP: (!) 151/93 BP Source: Arm, Left Upper   Temp: 36.5 ?C (97.7 ?F)   SpO2: 95%   O2 Device: None (Room air)       REVIEW OF SYSTEMS: 10 point ROS obtained and negative except back and hip pain      PHYSICAL EXAM:  General: 60 y.o. female appears stated age, in no acute distress  HEENT: Normocephalic, atraumatic  Neck: No thyroidmegaly  Cardiovascular: Well perfused  Pulmonary: Unlabored respirations  Extremities: No cyanosis, clubbing, or edema  Skin: No lesions seen on exposed skin  Psychiatric:  Appropriate mood and affect  Musculoskeletal: No atrophy.   Neurologic: Antigravity strength in all extremities. CN II -XII grossly intact.  Alert and oriented x 3.           IMPRESSION:  1. Sacroiliitis (HCC)    2. Greater trochanteric pain syndrome         PLAN: Sacroiliac Joint Injection bilateral and bilateral greater trochanteric bursa injections

## 2020-11-12 NOTE — Discharge Instructions - Supplementary Instructions
GENERAL POST PROCEDURE INSTRUCTIONS    Time:____________________  Physician:_________________________________    Procedure Completed Today:  Joint Injection (hip, knee, shoulder)  Cervical Epidural Steroid Injection  Cervical Transforaminal Steroid Injection  Trigger Point Injection  Other___________________________ Thoracic Epidural Steroid Injection  Lumbar Epidural Steroid Injection  Lumbar Transforaminal Steroid Injection  Facet Joint Injection     Important information following your procedure today:  You may drive today     If you had sedation ,you may NOT drive today  Rest at home for the next 6 hours.  You may then begin to resume your normal activities.  DO NOT drive any vehicle, operate any power tools, drink alcohol, make any major decisions, or sign any legal documents for the next 12 hours.  Pain relief may not be immediate. It is possible you may even experience an increase in pain during the first 24-48 hours followed by a gradual decrease of your pain.  Though the procedure is generally safe and complications are rare, we do ask that you be aware of any of the following:  Any swelling, persistent redness, new bleeding or drainage from the site of the injection.  You should not experience a severe headache.  You should not run a fever over 101oF.  New onset of sharp, severe back and or neck pain.  New onset of upper or lower extremity numbness or weakness.  New difficulty controlling bowel or bladder function after injection.  New shortness of breath.  If any of these occur, please call to report this occurrence to a nurse at [phone]. If you are calling after 4:00pm, on the weekends or holidays please call [phone] and ask to have the pain management resident physician on call for the physician paged or go to your local emergency room.   You may experience soreness at the injection site. Ice can be applied at 20 minute intervals for the first 24 hours. The following day you may alternate ice with heat if you are experiencing muscle tightness, otherwise continue with ice. Ice works best at decreasing pain. Avoid application of direct heat, hot showers or hot tubs today.  Avoid strenuous activity today. You many resume your regular activities and exercise tomorrow.  Patients with diabetes may see an elevation in blood sugars for 7-10 days after the injection. It is important to pay close attention to you diet, check your blood sugars daily and repost extreme elevations to the physician that treats your diabetes.  Patients taking daily blood thinners can resume their regular dose this evening.  It is important that you take all medications ordered by your pain physician. Taking medications as ordered is an important part of you pain care plan. If you cannot continue the medication plan, please notify the physician.  Possible side effects to steroids that may occur:  Flushing or redness of the face  Irritability  Fluid retention  Change in women's menses  Follow up appointment as needed if in the event you are unable to keep an appointment please notify the scheduler 24 hours in advance at [phone].    ____________________________________  ____________________________________

## 2020-11-12 NOTE — Procedures
Attending Surgeon: Lizbeth Bark, MD    Anesthesia: Local    Pre-Procedure Diagnosis:   1. Sacroiliitis (HCC)    2. Greater trochanteric pain syndrome        Post-Procedure Diagnosis:   1. Sacroiliitis (HCC)    2. Greater trochanteric pain syndrome             [name] LARGE JOINT DRAIN/INJECT PROC: bilateral greater trochanteric bursa    Consent:   Consent obtained: verbal and written  Consent given by: patient  Risks discussed: bleeding, damage to surrounding structures, hyperglycemia, infection, pain and skin discoloration  Alternatives discussed: alternative treatment and no treatment  Discussed with patient the purpose of the treatment/procedure, other ways of treating my condition, including no treatment/ procedure and the risks and benefits of the alternatives. Patient has decided to proceed with treatment/procedure.        Universal Protocol:  Relevant documents: relevant documents present and verified  Test results: test results available and properly labeled  Imaging studies: imaging studies available  Required items: required blood products, implants, devices, and special equipment available  Site marked: the operative site was marked  Patient identity confirmed: Patient identify confirmed verbally with patient.        Time out: Immediately prior to procedure a time out was called to verify the correct patient, procedure, equipment, support staff and site/side marked as required      Procedures Details:  Procedure Peformed: Injection Only  Indications: pain  Details:Prep: 2% chlorhexidine   22 G needle, fluoroscopy-guided       lateral approachOutcome: tolerated well, no immediate complications  Comments: INTERVENTIONAL PAIN MANAGEMENT PROCEDURE REPORT    Intra-articular Hip Joint Injection    Date of Service: 11/12/2020    Procedure Title(s):    1.Bilateral intra-articular hip joint injection   2. Intraoperative fluoroscopy     Attending Surgeon: Lizbeth Bark, MD    Anesthesia: Local Anxiolysis No           Procedural sedation No    Indications: Erin Sanchez is a 61 y.o. 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 The patient's history and physical exam were reviewed. The risks, benefits and alternatives to the procedure were discussed, and all questions were answered to the patient's satisfaction. The patient agreed to proceed, and written informed consent was obtained.     Procedure in Detail: IV was started? No    The patient was brought into the procedure room and placed in the prone position on the fluoroscopy table. Standard monitors were placed, and vital signs were observed throughout the procedure. The area of the Right hip was prepped with Betadine and draped in a sterile manner.     The right femoral neck was identified and marked under AP fluoroscopy. The skin and subcutaneous tissues were anesthetized with 1% lidocaine. A 22-gauge, 5-inch spinal needle was advanced until bone was contacted. Negative aspiration was confirmed and 1mL of contrast was injected and the intra-articular space was appropriately outlined.     Then, a solution consisting of 2 mL triamcinolone (80mg /mL) and 2 mL 0.25% bupivacaine was injected. The needle was removed with a lidocaine flush.    The same procedure was then performed on the opposite side: Yes.    The patient's back was cleaned and a bandage was placed over the site of needle insertion.    Disposition: The patient tolerated the procedure well, and there were no apparent complications. Vital signs remained stable througtout the procedure.  The patient was taken to the recovery area where discharge instructions for the procedure were given.     Estimated Blood Loss: minimal    Specimens: none    Complications: None      [facility] SI JOINT INJECT ANESTH/STEROID PROC  Location: sacroiliac joint     Consent:   Consent obtained: written and verbal  Consent given by: patient  Risks discussed: damage to surrounding structures, infection and pain  Alternatives discussed: alternative treatment     Universal Protocol:  Relevant documents: relevant documents present and verified  Test results: test results available and properly labeled  Imaging studies: imaging studies available  Required items: required blood products, implants, devices, and special equipment available  Site marked: the operative site was marked  Patient identity confirmed: Patient identify confirmed verbally with patient.        Time out: Immediately prior to procedure a time out was called to verify the correct patient, procedure, equipment, support staff and site/side marked as required      Procedures Details:   Indications: pain   Prep: 2% chlorhexidine  Guidance: fluoroscopy  Needle size: 22 G  Approach: posterior  Patient tolerance: Patient tolerated the procedure well with no immediate complications. Pressure was applied, and hemostasis was accomplished.  Comments: DESCRIPTION OF PROCEDURE:  The procedure risks and benefits were explained.  Informed consent was obtained from the patient.  The patient was placed in prone position on the fluoroscopy table.  Blood pressure cuff and oxygen saturation monitors were attached and the patient was monitored throughout the procedure.  The left SI joint was identified with the use of fluoroscopy in the AP view.  The skin was prepped using Chlorhexadine and draped in aseptic fashion.  C-arm was rotated obliquely towards the right side to line up the anterior and posterior margins of the SI joint.  Skin and subcutaneous tissue were anesthetized using 2.5 mL of 1 percent lidocaine with 25-gauge needle.  A 3-1/2-inch 22-gauge spinal needle was advanced parallel to the x-ray beam towards the lower third of the joint line.  The needle was advanced slowly until the tip of the needle made contact with the bone.  The needle was walked off from the bone to the joint space.  We subsequently injected 0.4 mL of Isovue contrast dye.  An arthrogram was seen.  After negative aspiration, a solution containing 1 mL of 1 percent lidocaine and 40 mg of triamcinolone was injected.  The stylet was re-inserted and needle was then removed.  There was no sensory or motor deficit in the extremity noted.     Attention was then taken to the right SI joint, which was identified with the use of fluoroscopy in the AP view.  The skin was prepped using Chlorhexadine and draped in aseptic fashion.  The C-arm was rotated obliquely towards the left side to line up the anterior and posterior margins at the SI joint.  The skin and subcutaneous tissue was anesthetized using 2.5 mL of 1 percent lidocaine with 25-gauge needle.  A 3-1/2-inch, 22-gauge spinal needle was advanced parallel to the x-ray beam towards the lower third of the joint line.  The needle was advanced slowly until the tip of the needle made contact with the bone.  The needle was walked off from the bone to the joint space.  We injected 0.4 mL of Isovue contrast dye and an arthrogram was seen.  After negative aspiration, a solution containing 1 mL of 1 percent lidocaine  and 40 mg of triamcinolone was injected.  The stylet was re-inserted and the needle was then removed.  There was no sensory or motor deficit in the extremity noted.      After the procedure, the patient's blood pressure, heart rate, oxygen saturation, and VAS were recorded in the chart.  There were no complications.  The patient tolerated the procedure well and was brought to the recovery room for observation in stable condition and discharged with written discharge instructions.     PLAN OF CARE:  The patient is to follow up in the interventional spine clinic in 4-6 weeks.     The patient was advised to contact the interventional spine center for any of the following:    1. Fever, chills, or night sweats.  2. New onset severe sharp pain.  3. Any new upper or lower extremity weakness or numbness.  4. Any questions regarding the procedure.     If unable to contact the interventional spine center, the patient was instructed to go to the local emergency room.         Estimated blood loss: none or minimal  Specimens: none  Patient tolerated the procedure well with no immediate complications. Pressure was applied, and hemostasis was accomplished.

## 2020-11-14 ENCOUNTER — Encounter: Admit: 2020-11-14 | Discharge: 2020-11-14 | Payer: MEDICAID

## 2020-11-14 DIAGNOSIS — U099 Post-COVID syndrome: Secondary | ICD-10-CM

## 2020-11-19 ENCOUNTER — Encounter: Admit: 2020-11-19 | Discharge: 2020-11-19 | Payer: MEDICAID

## 2020-11-19 DIAGNOSIS — R0600 Dyspnea, unspecified: Secondary | ICD-10-CM

## 2020-11-19 DIAGNOSIS — U099 Post-COVID syndrome: Secondary | ICD-10-CM

## 2020-11-19 NOTE — Progress Notes
Name: Erin Sanchez           MRN: [id number]                 DOB: [birth year]          Age: 60 y.o.    PULMONARY REHAB INDIVIDUALIZED TREATMENT PLAN - Reassessment 60 Day          ITP Date: 10/22/2020  Diagnosis: Dyspnea/Post-COVID  Physician: Estate agent, J    Sessions Completed: 11    Oxygen    Assessment    O2 Usage(l/min)                           Rest: 0   ADLs: 0  Exercise: 0  Sleep: 0    Plan (Goals, Intervention, Education)  []  Not applicable, Patient does not use oxygen     Goals    [x]  Maintain O2 saturation >90% at rest and during exercise.  [x]  Managing O2 independently  Progress towards goals (if applicable): Patient has been able to maintain O2 sats above 90% while on RA during Pulmonary Rehab.      Intervention   []  Train in O2 safety  [x]  Appropriate use of O2 with rest and exercise  []  Titrate O2 based on response to exercise  []  Other: N/A     Education    none      Pt. Response: N/A    Exercise     Assessment    Home Freq:  0 days/week    Home Dur: 0 minutes     Total Minutes per Week: 80    [x]  Attending PR regularly (2x per week)    First Exercise Day: 09/24/2020    Previous ITP METs: 1.9-4.2    Plan (Goals, Intervention, Education)   Goals    [x]  Exercise at least 150 minutes/week  [x]  Exercise at home 2-3 days/week    Progress towards Exercise Goals: Patient reports that she has not been doing any home exercise other than occasional walking and once mowing her lawn. Patient, however, was excited to report today that her ADL's have been become much easier for her to do without needing to take as many breaks. Patient has not had any increase in her max METs but has increased her minimum METs from 1.9 to 2.5. Patient has also  increased her watts on both the stepper machine (46 to 50W) and on the arm bike (12 to 20W).      Intervention  Exercise Prescription:  Mode/Duration/Intensity:   Treadmill: 15 minutes/bout, 1.8 mph @ 1% grade  Bike: 15 minutes/bout, 42 average watts  Stepper:  15 minutes/bout, 50 average watts  UBE: 15 minutes/bout, 20 average watts    Freq (d/week):  2    HR Range: Rest + 20-30 bpm  METs: 2.5-4.2  Progression:  Increase Watts and METs by 10-20% per week as tolerated  O2 Prescription: Room Air     Education    None      Pt. Response: N/A    Nutrition    Assessment    Weight: 196 lbs    Ht: 65 inches     BMI: 31.6          []  Drug/Alcohol Use: None  Days/week: 0   Drinks/day: 0    Plan (Goals, Intervention, Education)   Goals  []  Compliant with cholesterol medication  [x]  Progress toward goal weight     Goal: 190  [x]   Progress toward Dietary Goals: Patient's weight is down 3 lbs since last ITP and has remained fairly constant throughout the program      Intervention  [] Dietitian consult     Date: NA  [x] Declined  [x]  Staff/Patient Discussion  []  Other:N/A     Education    None      Pt. Response: N/A              Psychosocial Assessment    Assessment    []  S/s of depression: Denies  []  Medication:  Wellbutrin, Effexor    Plan (Goals, Intervention, Education)   Goals  [x]  Stress-management techniques  [x]  Sleep management techniques  Progress toward goal:  Patient reports that she feels better physically and mentally than she has in about a year. Patient denies any current problems with mental health and is very positive about her experience in the pulmonary rehab thus far.          Intervention  []  Reassess PHQ-9 if initial >4 (30 day review only): Score N/A  []  Psych consult   [x]  Identify stressors  [x]  Staff/Patient Discussion  []  Other:N/A    Education   None     Pt. Response: N/A               Prevent/Manage Exacerbations (Other Core Component)    Assessment    Recent Exacerbation: None    Current Symptoms: Excess phlegm/mucus production    Vaccines: Flu No     Pneum No    Plan (Goals, Intervention, Education)   Goals   [x]  Develop/Review Action plan  [x]  Identify triggers/irritants  [x]  Receive vaccinations as applicable  Progress towards goals: Patient reports that she is doing very well since starting the program. Patient denies having any current symptoms at this time other than some excess clear phlegm.      Intervention  [x]  Discuss triggers/irritants   []  Notify MD of change in health status  []  Other: N/A     Education    Infection Prevention       Date: 10/24/2020      Pt. Response: Verbalized Understanding    Tobacco Cessation (Other Core Component)    Assessment     Quit >6 months ago    Plan (Goals, Intervention, Education)  []  Not applicable, Patient does not use Tobacco   Goals  []  Progress toward goal: Quit 20+ years ago  PPD: N/A    Intervention     []  Smoking Cessation Referral      []  Staff/Patient discussion  []  Alternatives (gum, patch, meds, etc)    []  [employer] Information Given  []  Other:N/A    Education    None      Pt. Response: N/A    Dyspnea (Other Core Component)    Assessment     Dyspnea: None       Plan (Goals, Intervention, Education)     Goals  Dyspnea Worsening, Same, or Improving: Improving  []  Progress toward goal: Patient reports that she is feeling better now that she has done in a year. Patient explains that she can now her all of her ADL's without getting out of breath and needing frequent rest breaks. Patient reported that she was even able to mow her lawn with a push mower in several 15 minutes bouts with rest in between.     Intervention  []  Pt. demonstrates pursed lip breathing  [x]  Other: Staff discussion     Education  Travel       Date: 11/14/2020      Pt. Response: Verbalized Understanding    BP Control (Other Core Component)    Assessment    BP: 108/80          History of HTN: Yes    []  Medication: Chorthalidone    Plan (Goals, Intervention, Education)   Goals  [x]  BP <130/80  [x]  Progress toward goal: Patient's BP (diastolic) is generally slihglty elevated in the 80's and twice in the 90's. Patient's systolic value is generally more normal but sometimes elevated into the 140's and 130's. Will continue to monitor.     Intervention        []  Notify PCP   [x]  Staff/Patient Discussion  []  Other: N/A    Education    None      Pt. Response: N/A                                             Diabetes (Other Core Component)    Assessment     FBG: Unknown. Patient does not check BS in the morning.Patient checks BS throughout the day and generally runs in the 130's or low (with average 115).      [x]  Medication: Metformin    Plan (Goals, Intervention, Education)  []  N/A, Patient is not Diabetic   Goals    [x]  Monitor BS at home  [x]  Medication Compliance  [x]  Progress toward goal: Patient is compliant with all medication and occasionally checks her BS at home but not often. Patietn reports that her numbers are normally fairly good so she does not bother to check more often. Patient was encouraged to start checking at least 1x day or as often as recommended by her physician.      Intervention   []  Referral to diabetic educator   [x]  Staff/ Pt Discussion   []  Other: N/A    Education    None      Pt. Response: N/A     Medications    Assessment     Taking medications as prescribed: Yes    Plan (Goals, Intervention, Education)   Goals  [x]  Med Review completed  [x] Compliance of at least 80%    Intervention   [x]  Staff/Patient Discussion   [x]  Medication Review   []  Other: N/A    Education  None      Pt. Response: N/A        Additional Comments:    Patient has completed 10 sessions in pulmonary rehab thus far. Patient was very excited to report that the program is going well and that she is feeling better now than she has in about a year. Patient denies any structured home other than occasional walking and once mowing her lawn but explains that she is no longer having SOB or needing frequent rest breaks while doing her ADL's. Patient was encouraged to start adding in at least one day of home exercise per week and then to gradually increase from there. Patient verbalized understanding. Patient denies any symptoms at this time other than some excess mucus (clear) production. Patient also denies any other pulmonary related exacerbations but reports that recently hospitalized due to complications from a stomach bug and her hernia. Patient reports that she is doing much better now. Patient is meeting with her doctor on th 16th to discuss possible surgery  for her hernia. Patient's weight is back down 3 lbs since last ITP and overall has remained constat throughout the program. Patient's BP continues to elevated at times (especially with diastolic value). Will continue to monitor. Medications were reviewed. Will continue to monitor.

## 2020-11-21 ENCOUNTER — Encounter: Admit: 2020-11-21 | Discharge: 2020-11-26 | Payer: MEDICAID

## 2020-11-26 ENCOUNTER — Encounter: Admit: 2020-11-26 | Discharge: 2020-11-26 | Payer: MEDICAID

## 2020-11-26 DIAGNOSIS — U099 Post-COVID syndrome: Secondary | ICD-10-CM

## 2020-11-28 ENCOUNTER — Encounter: Admit: 2020-11-28 | Discharge: 2020-11-28 | Payer: MEDICAID

## 2020-11-28 DIAGNOSIS — U099 Post-COVID syndrome: Secondary | ICD-10-CM

## 2020-12-03 ENCOUNTER — Encounter: Admit: 2020-12-03 | Discharge: 2020-12-03 | Payer: MEDICAID

## 2020-12-03 DIAGNOSIS — U099 Post-COVID syndrome: Secondary | ICD-10-CM

## 2020-12-05 ENCOUNTER — Ambulatory Visit: Admit: 2020-12-05 | Discharge: 2020-12-06 | Payer: MEDICAID

## 2020-12-05 ENCOUNTER — Encounter: Admit: 2020-12-05 | Discharge: 2020-12-05 | Payer: MEDICAID

## 2020-12-05 DIAGNOSIS — M9902 Segmental and somatic dysfunction of thoracic region: Secondary | ICD-10-CM

## 2020-12-05 DIAGNOSIS — K219 Gastro-esophageal reflux disease without esophagitis: Secondary | ICD-10-CM

## 2020-12-05 DIAGNOSIS — D539 Nutritional anemia, unspecified: Secondary | ICD-10-CM

## 2020-12-05 DIAGNOSIS — Z8679 Personal history of other diseases of the circulatory system: Secondary | ICD-10-CM

## 2020-12-05 DIAGNOSIS — K449 Diaphragmatic hernia without obstruction or gangrene: Secondary | ICD-10-CM

## 2020-12-05 DIAGNOSIS — K769 Liver disease, unspecified: Secondary | ICD-10-CM

## 2020-12-05 DIAGNOSIS — B1920 Unspecified viral hepatitis C without hepatic coma: Secondary | ICD-10-CM

## 2020-12-05 DIAGNOSIS — K7460 Unspecified cirrhosis of liver: Secondary | ICD-10-CM

## 2020-12-05 DIAGNOSIS — K279 Peptic ulcer, site unspecified, unspecified as acute or chronic, without hemorrhage or perforation: Secondary | ICD-10-CM

## 2020-12-05 DIAGNOSIS — E119 Type 2 diabetes mellitus without complications: Secondary | ICD-10-CM

## 2020-12-05 DIAGNOSIS — R42 Dizziness and giddiness: Secondary | ICD-10-CM

## 2020-12-05 DIAGNOSIS — F419 Anxiety disorder, unspecified: Secondary | ICD-10-CM

## 2020-12-05 NOTE — Progress Notes
Department of Metabolic, Bariatric, and Minimally Invasive Surgery         Reason for Visit:  Ongoing bariatric evaluation    HPI:  Erin Sanchez is a 60 y.o. female seen today in follow up to review results of recent CT after ER visit, discussion of recurrent paraesophageal hernia repair.  Patient reports her breathing is much improved having undergone pulmonary rehab.  Patient with long-COVID syndrome.  Patient reports current gas prices preclusive of her continuing with frequent pulm rehab visits.  Reports went to ER with refractory dry heaving.  States resolved after hospital admission.  Patient reports she is currently miserable with her current heartburn and dyspepsia symptoms.  She understands revisional antireflux procedure is high risk given her co-morbidities but doesn't feel she can continue with current swallowing symptoms.    States underwent mammogram at outside facility and no pathology noted to correlate with CT findings.    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    Has had CT. mild varices.  10/21 EGD:  LA Grade A esophagitis, large HH, no varices.  08/01/20:  Upper GI and GET ordered, Pulm rehab ordered (post-COVID severe pulm debility)    Current Medications:    Current Outpatient Medications:   ?  acetaminophen (TYLENOL) 500 mg tablet, Take 500 mg by mouth twice daily. Max of 4,000 mg of acetaminophen in 24 hours., Disp: , Rfl:   ?  albuterol sulfate (PROAIR HFA) 90 mcg/actuation HFA aerosol inhaler, Inhale 2 puffs by mouth into the lungs daily as needed for Wheezing or Shortness of Breath. Shake well before use., Disp: , Rfl:   ?  cyclobenzaprine (FLEXERIL) 5 mg tablet, TAKE 1-2 TABLETS (5-10 MG TOTAL) BY MOUTH 3 (THREE) TIMES A DAY AS NEEDED FOR MUSCLE SPASMS., Disp: , Rfl:   ?  diclofenac sodium (VOLTAREN) 1 % topical gel, Apply four g topically to affected area four times daily., Disp: 300 g, Rfl: 1  ? ibuprofen (ADVIL) 200 mg tablet, Take 800 mg by mouth twice daily as needed for Pain. Take with food., Disp: , Rfl:   ?  metFORMIN-XR (GLUCOPHAGE XR) 500 mg extended release tablet, Take 2 tablets by mouth daily., Disp: , Rfl:   ?  omeprazole DR (PRILOSEC) 40 mg capsule, TAKE 1 CAPSULE BY MOUTH TWICE A DAY BEFORE MEALS, Disp: 180 capsule, Rfl: 1  ?  ondansetron HCL (ZOFRAN) 4 mg tablet, Take one tablet to two tablets by mouth every 8 hours as needed., Disp: 30 tablet, Rfl: 0  ?  potassium chloride SR (K-DUR) 20 mEq tablet, Take 20 mEq by mouth twice daily., Disp: , Rfl:   ?  traZODone (DESYREL) 50 mg tablet, Take 25-100 mg by mouth daily as needed., Disp: , Rfl:   ?  valsartan (DIOVAN) 80 mg tablet, Take 80 mg by mouth daily., Disp: , Rfl:   ?  venlafaxine XR (EFFEXOR XR) 150 mg capsule, Take 150 mg by mouth daily., Disp: , Rfl:     Allergies:  Allergies   Allergen Reactions   ? Codeine ITCHING   ? Ciprofloxacin UNKNOWN     Used for an eye infection, caused patient to be very ill.       PE:  Vitals:    12/05/20 1437   BP: (!) 140/81   BP Source: Arm, Right Upper   Pulse: 91   Temp: 36.3 ?C (97.3 ?F)   SpO2: 97%  TempSrc: Temporal   PainSc: Zero   Weight: 89.1 kg (196 lb 6.4 oz)   Height: 167.6 cm (5' 6)        89.1 kg (196 lb 6.4 oz)  Body mass index is 31.7 kg/m?.  Gen:  A/Ox3, NAD  HEENT:  EOMI, sclera anicteric  Chest:  nonlabored, able to speak in complete sentences without difficulty  Derm:  No rashes apparent        Lab/Radiology/Other Diagnostic Tests:  Hemoglobin   Date Value Ref Range Status   11/07/2020 13.8 12.0 - 15.0 GM/DL Final     Absolute Monocyte Count   Date Value Ref Range Status   11/06/2020 0.68 0 - 0.80 K/UL Final     Sodium   Date Value Ref Range Status   11/07/2020 140 137 - 147 MMOL/L Final     Potassium   Date Value Ref Range Status   11/07/2020 3.7 3.5 - 5.1 MMOL/L Final     Chloride   Date Value Ref Range Status   11/07/2020 107 98 - 110 MMOL/L Final     CO2   Date Value Ref Range Status   11/07/2020 23 21 - 30 MMOL/L Final     Anion Gap   Date Value Ref Range Status   11/07/2020 10 3 - 12 Final     Blood Urea Nitrogen   Date Value Ref Range Status   11/07/2020 6 (L) 7 - 25 MG/DL Final     Creatinine   Date Value Ref Range Status   11/07/2020 0.65 0.4 - 1.00 MG/DL Final     Glucose   Date Value Ref Range Status   11/07/2020 140 (H) 70 - 100 MG/DL Final     Calcium   Date Value Ref Range Status   11/07/2020 8.4 (L) 8.5 - 10.6 MG/DL Final     AST (SGOT)   Date Value Ref Range Status   11/07/2020 21 7 - 40 U/L Final     ALT (SGPT)   Date Value Ref Range Status   11/07/2020 23 7 - 56 U/L Final     Alk Phosphatase   Date Value Ref Range Status   11/07/2020 66 25 - 110 U/L Final     No results found for: CHOL, TRIG, HDL, LDL, VLDL  T4-Free   Date Value Ref Range Status   10/07/2011 1.0  Final     TSH   Date Value Ref Range Status   04/05/2012 1.47  Final     TSH Thyroid Screen   Date Value Ref Range Status   07/12/2012 1.46  Final     Hemoglobin A1C   Date Value Ref Range Status   11/06/2020 5.9 4.0 - 6.0 % Final     Comment:     The ADA recommends that most patients with type 1 and type 2 diabetes maintain   an A1c level <7%.                                                                             Assessment:  60 y.o. female with a recurrent, large paraesophageal hiatal hernia, HCV cirrhosis with mild varices    Plan:  I discussed with  the patient the surgical option of Laparoscopic recurrent paraesophageal hernia repair with nissen fundoplication, possible mesh, possible gastrostomy tube.  We discussed given complex nature and mild varices significant increase morbidity of surgery including potential need for blood transfusion.  She states understanding of significantly elevated surgical risk but states she is miserable and cannot continue with her current swallowing symptoms and wishes to proceed with surgery.  Discussed if unable to safely perform procedure, may have to proceed with G tube for gastropexy as back up option.  Patient now much improved with with pulm rehab.  Again, states understanding of increased potential pulmonary risk of surgery including need for prolonged mechanical ventilation for respiratory failure and wishes to proceed.           Total time 35 minutes.  Estimated counseling time 30 minutes.        Bufford Lope, MD                                                                            12/05/20

## 2020-12-10 ENCOUNTER — Encounter: Admit: 2020-12-10 | Discharge: 2020-12-10 | Payer: MEDICAID

## 2020-12-10 DIAGNOSIS — U099 Post-COVID syndrome: Secondary | ICD-10-CM

## 2020-12-12 ENCOUNTER — Encounter: Admit: 2020-12-12 | Discharge: 2020-12-12 | Payer: MEDICAID

## 2020-12-12 DIAGNOSIS — U099 Post-COVID syndrome: Secondary | ICD-10-CM

## 2020-12-13 ENCOUNTER — Encounter: Admit: 2020-12-13 | Discharge: 2020-12-13 | Payer: MEDICAID

## 2020-12-16 ENCOUNTER — Encounter: Admit: 2020-12-16 | Discharge: 2020-12-16 | Payer: MEDICAID

## 2020-12-16 ENCOUNTER — Inpatient Hospital Stay: Admit: 2020-12-16 | Discharge: 2020-12-16 | Payer: MEDICAID

## 2020-12-16 ENCOUNTER — Inpatient Hospital Stay: Admit: 2020-12-16 | Payer: MEDICAID

## 2020-12-16 ENCOUNTER — Emergency Department: Admit: 2020-12-16 | Discharge: 2020-12-16 | Payer: MEDICAID

## 2020-12-16 DIAGNOSIS — K769 Liver disease, unspecified: Secondary | ICD-10-CM

## 2020-12-16 DIAGNOSIS — E119 Type 2 diabetes mellitus without complications: Secondary | ICD-10-CM

## 2020-12-16 DIAGNOSIS — K449 Diaphragmatic hernia without obstruction or gangrene: Secondary | ICD-10-CM

## 2020-12-16 DIAGNOSIS — R1110 Vomiting, unspecified: Secondary | ICD-10-CM

## 2020-12-16 DIAGNOSIS — B1920 Unspecified viral hepatitis C without hepatic coma: Secondary | ICD-10-CM

## 2020-12-16 DIAGNOSIS — F419 Anxiety disorder, unspecified: Secondary | ICD-10-CM

## 2020-12-16 DIAGNOSIS — K279 Peptic ulcer, site unspecified, unspecified as acute or chronic, without hemorrhage or perforation: Secondary | ICD-10-CM

## 2020-12-16 DIAGNOSIS — K219 Gastro-esophageal reflux disease without esophagitis: Secondary | ICD-10-CM

## 2020-12-16 DIAGNOSIS — M9902 Segmental and somatic dysfunction of thoracic region: Secondary | ICD-10-CM

## 2020-12-16 DIAGNOSIS — R42 Dizziness and giddiness: Secondary | ICD-10-CM

## 2020-12-16 DIAGNOSIS — D539 Nutritional anemia, unspecified: Secondary | ICD-10-CM

## 2020-12-16 DIAGNOSIS — Z8679 Personal history of other diseases of the circulatory system: Secondary | ICD-10-CM

## 2020-12-16 DIAGNOSIS — K7460 Unspecified cirrhosis of liver: Secondary | ICD-10-CM

## 2020-12-16 LAB — MAGNESIUM: MAGNESIUM: 2.2 mg/dL — ABNORMAL HIGH (ref 1.6–2.6)

## 2020-12-16 LAB — POC LACTATE: LACTIC ACID POC: 2 MMOL/L (ref 0.5–2.0)

## 2020-12-16 LAB — PROTIME INR (PT)
INR: 1 (ref 0.8–1.2)
PROTIME: 11 s (ref 9.5–14.2)

## 2020-12-16 LAB — COMPREHENSIVE METABOLIC PANEL
ALBUMIN: 4.7 g/dL — ABNORMAL HIGH (ref 3.5–5.0)
ALK PHOSPHATASE: 86 U/L — ABNORMAL LOW (ref 25–110)
ALT: 44 U/L (ref 7–56)
ANION GAP: 17 K/UL — ABNORMAL HIGH (ref 3–12)
AST: 45 U/L — ABNORMAL HIGH (ref 7–40)
BLD UREA NITROGEN: 27 mg/dL — ABNORMAL HIGH (ref 7–25)
CALCIUM: 10 mg/dL (ref 8.5–10.6)
CHLORIDE: 108 MMOL/L — ABNORMAL HIGH (ref 98–110)
CO2: 17 MMOL/L — ABNORMAL LOW (ref 21–30)
CREATININE: 0.8 mg/dL (ref 0.4–1.00)
EGFR: >60 mL/min (ref >60)
SODIUM: 142 MMOL/L — ABNORMAL HIGH (ref 137–147)
TOTAL BILIRUBIN: 0.7 mg/dL (ref 0.3–1.2)
TOTAL PROTEIN: 8.2 g/dL — ABNORMAL HIGH (ref 6.0–8.0)

## 2020-12-16 LAB — LIPASE: LIPASE: 15 U/L — ABNORMAL HIGH (ref 11–82)

## 2020-12-16 LAB — CBC AND DIFF
ABSOLUTE BASO COUNT: 0 K/UL (ref 0–0.20)
ABSOLUTE EOS COUNT: 0 K/UL (ref 0–0.45)
ABSOLUTE MONO COUNT: 0.5 K/UL (ref 0–0.80)
WBC COUNT: 8.4 K/UL (ref 4.5–11.0)

## 2020-12-16 LAB — PTT (APTT): PTT: 28 s (ref 24.0–36.5)

## 2020-12-16 LAB — POC TROPONIN: TROPONIN I, POC: 0 ng/mL (ref 0.00–0.05)

## 2020-12-16 MED ORDER — ACETAMINOPHEN 325 MG PO TAB
650 mg | ORAL | 0 refills | Status: AC | PRN
Start: 2020-12-16 — End: ?
  Administered 2020-12-17 – 2020-12-20 (×6): 650 mg via ORAL

## 2020-12-16 MED ORDER — ONDANSETRON 4 MG PO TBDI
4 mg | ORAL | 0 refills | Status: AC | PRN
Start: 2020-12-16 — End: ?
  Administered 2020-12-17 – 2020-12-20 (×7): 4 mg via ORAL

## 2020-12-16 MED ORDER — TRAZODONE 50 MG PO TAB
150 mg | Freq: Every evening | ORAL | 0 refills | Status: AC | PRN
Start: 2020-12-16 — End: ?
  Administered 2020-12-17: 06:00:00 150 mg via ORAL

## 2020-12-16 MED ORDER — VALSARTAN 80 MG PO TAB
80 mg | Freq: Every day | ORAL | 0 refills | Status: AC
Start: 2020-12-16 — End: ?
  Administered 2020-12-17 – 2020-12-20 (×4): 80 mg via ORAL

## 2020-12-16 MED ORDER — VENLAFAXINE 150 MG PO CP24
150 mg | Freq: Every day | ORAL | 0 refills | Status: AC
Start: 2020-12-16 — End: ?
  Administered 2020-12-17 – 2020-12-20 (×4): 150 mg via ORAL

## 2020-12-16 MED ORDER — LACTATED RINGERS IV SOLP
1000 mL | Freq: Once | INTRAVENOUS | 0 refills | Status: CP
Start: 2020-12-16 — End: ?
  Administered 2020-12-16: 22:00:00 1000 mL via INTRAVENOUS

## 2020-12-16 MED ORDER — PANTOPRAZOLE 40 MG IV SOLR
80 mg | Freq: Once | INTRAVENOUS | 0 refills | Status: CP
Start: 2020-12-16 — End: ?
  Administered 2020-12-17: 01:00:00 80 mg via INTRAVENOUS

## 2020-12-16 MED ORDER — ONDANSETRON HCL (PF) 4 MG/2 ML IJ SOLN
4 mg | INTRAVENOUS | 0 refills | Status: AC | PRN
Start: 2020-12-16 — End: ?
  Administered 2020-12-17 – 2020-12-19 (×6): 4 mg via INTRAVENOUS

## 2020-12-16 MED ORDER — PANTOPRAZOLE 40 MG IV SOLR
40 mg | Freq: Two times a day (BID) | INTRAVENOUS | 0 refills | Status: AC
Start: 2020-12-16 — End: ?
  Administered 2020-12-17 – 2020-12-19 (×5): 40 mg via INTRAVENOUS

## 2020-12-16 MED ORDER — CEFAZOLIN IVPB
3 g | Freq: Once | INTRAVENOUS | 0 refills
Start: 2020-12-16 — End: ?

## 2020-12-16 MED ORDER — FENTANYL CITRATE (PF) 50 MCG/ML IJ SOLN
50 ug | Freq: Once | INTRAVENOUS | 0 refills | Status: CP
Start: 2020-12-16 — End: ?
  Administered 2020-12-16: 22:00:00 50 ug via INTRAVENOUS

## 2020-12-16 MED ORDER — POLYETHYLENE GLYCOL 3350 17 GRAM PO PWPK
1 | Freq: Every day | ORAL | 0 refills | Status: AC | PRN
Start: 2020-12-16 — End: ?

## 2020-12-16 MED ORDER — ACETAMINOPHEN 1,000 MG/100 ML (10 MG/ML) IV SOLN
1000 mg | Freq: Once | INTRAVENOUS | 0 refills
Start: 2020-12-16 — End: ?

## 2020-12-16 MED ORDER — POTASSIUM CHLORIDE 20 MEQ PO TBTQ
20 meq | Freq: Two times a day (BID) | ORAL | 0 refills | Status: AC
Start: 2020-12-16 — End: ?
  Administered 2020-12-17 – 2020-12-20 (×7): 20 meq via ORAL

## 2020-12-16 MED ORDER — MELATONIN 5 MG PO TAB
5 mg | Freq: Every evening | ORAL | 0 refills | Status: AC | PRN
Start: 2020-12-16 — End: ?

## 2020-12-16 MED ORDER — ONDANSETRON HCL (PF) 4 MG/2 ML IJ SOLN
4 mg | Freq: Once | INTRAVENOUS | 0 refills | Status: CP
Start: 2020-12-16 — End: ?
  Administered 2020-12-16: 22:00:00 4 mg via INTRAVENOUS

## 2020-12-16 MED ORDER — ENOXAPARIN 40 MG/0.4 ML SC SYRG
40 mg | Freq: Every day | SUBCUTANEOUS | 0 refills | Status: CN
Start: 2020-12-16 — End: ?

## 2020-12-16 MED ORDER — SENNOSIDES-DOCUSATE SODIUM 8.6-50 MG PO TAB
1 | Freq: Every day | ORAL | 0 refills | Status: AC | PRN
Start: 2020-12-16 — End: ?

## 2020-12-16 MED ORDER — FAMOTIDINE (PF) 20 MG/2 ML IV SOLN
20 mg | Freq: Once | INTRAVENOUS | 0 refills
Start: 2020-12-16 — End: ?

## 2020-12-17 ENCOUNTER — Inpatient Hospital Stay: Admit: 2020-12-17 | Discharge: 2020-12-17 | Payer: MEDICAID

## 2020-12-17 MED ADMIN — OXYCODONE 5 MG PO TAB [10814]: 5 mg | ORAL | @ 19:00:00 | NDC 00904696661

## 2020-12-17 MED ADMIN — OXYCODONE 5 MG PO TAB [10814]: 5 mg | ORAL | @ 23:00:00 | NDC 00904696661

## 2020-12-17 MED ADMIN — LACTATED RINGERS IV SOLP [4318]: 1000.000 mL | INTRAVENOUS | @ 14:00:00 | NDC 00338011704

## 2020-12-17 MED ADMIN — FENTANYL CITRATE (PF) 50 MCG/ML IJ SOLN [3037]: 25 ug | INTRAVENOUS | @ 14:00:00 | NDC 63323080611

## 2020-12-17 MED ADMIN — LACTATED RINGERS IV SOLP [4318]: 1000.000 mL | INTRAVENOUS | NDC 00338011704

## 2020-12-17 MED ADMIN — OXYCODONE 5 MG PO TAB [10814]: 5 mg | ORAL | @ 14:00:00 | NDC 00904696661

## 2020-12-18 MED ADMIN — MAGNESIUM SULFATE IN D5W 1 GRAM/100 ML IV PGBK [166578]: 1 g | INTRAVENOUS | @ 20:00:00 | Stop: 2020-12-19 | NDC 00409672723

## 2020-12-18 MED ADMIN — TRAZODONE 50 MG PO TAB [8085]: 100 mg | ORAL | @ 02:00:00 | NDC 50111056001

## 2020-12-18 MED ADMIN — TRAMADOL 50 MG PO TAB [14632]: 50 mg | ORAL | @ 20:00:00 | NDC 68084080811

## 2020-12-18 MED ADMIN — LACTATED RINGERS IV SOLP [4318]: 1000.000 mL | INTRAVENOUS | @ 20:00:00 | NDC 00338011704

## 2020-12-18 MED ADMIN — FENTANYL CITRATE (PF) 50 MCG/ML IJ SOLN [3037]: 25 ug | INTRAVENOUS | @ 20:00:00 | NDC 63323080611

## 2020-12-18 MED ADMIN — CEFTRIAXONE INJ 1GM IVP [210253]: 1 g | INTRAVENOUS | @ 11:00:00 | NDC 00409733211

## 2020-12-18 MED ADMIN — LACTATED RINGERS IV SOLP [4318]: 1000.000 mL | INTRAVENOUS | @ 07:00:00 | NDC 00338011704

## 2020-12-18 MED ADMIN — POTASSIUM CHLORIDE 20 MEQ/15 ML PO LIQD [6432]: 40 meq | ORAL | @ 22:00:00 | Stop: 2020-12-18 | NDC 00121252030

## 2020-12-18 MED ADMIN — FENTANYL CITRATE (PF) 50 MCG/ML IJ SOLN [3037]: 25 ug | INTRAVENOUS | @ 23:00:00 | NDC 63323080611

## 2020-12-18 MED ADMIN — LIDOCAINE 5 % TP PTMD [80759]: 1 | TOPICAL | @ 17:00:00 | NDC 00591352530

## 2020-12-18 MED ADMIN — FENTANYL CITRATE (PF) 50 MCG/ML IJ SOLN [3037]: 25 ug | INTRAVENOUS | @ 14:00:00 | NDC 63323080611

## 2020-12-19 ENCOUNTER — Inpatient Hospital Stay: Admit: 2020-12-19 | Discharge: 2020-12-19 | Payer: MEDICAID

## 2020-12-19 MED ADMIN — FENTANYL CITRATE (PF) 50 MCG/ML IJ SOLN [3037]: 25 ug | INTRAVENOUS | @ 16:00:00 | NDC 63323080611

## 2020-12-19 MED ADMIN — LACTATED RINGERS IV SOLP [4318]: 1000.000 mL | INTRAVENOUS | @ 16:00:00 | NDC 00338011704

## 2020-12-19 MED ADMIN — MAGNESIUM SULFATE IN D5W 1 GRAM/100 ML IV PGBK [166578]: 1 g | INTRAVENOUS | @ 01:00:00 | Stop: 2020-12-19 | NDC 00409672723

## 2020-12-19 MED ADMIN — LACTATED RINGERS IV SOLP [4318]: 1000.000 mL | INTRAVENOUS | @ 06:00:00 | NDC 00338011704

## 2020-12-19 MED ADMIN — POTASSIUM CHLORIDE 20 MEQ PO TBTQ [35943]: 40 meq | ORAL | @ 16:00:00 | Stop: 2020-12-19 | NDC 00904708661

## 2020-12-19 MED ADMIN — SCOPOLAMINE BASE 1 MG OVER 3 DAYS TD PT3D [82141]: 1 | TRANSDERMAL | @ 16:00:00 | NDC 50742050501

## 2020-12-19 MED ADMIN — CEFTRIAXONE INJ 1GM IVP [210253]: 1 g | INTRAVENOUS | @ 11:00:00 | NDC 00409733211

## 2020-12-19 MED ADMIN — TRAMADOL 50 MG PO TAB [14632]: 50 mg | ORAL | @ 05:00:00 | NDC 68084080811

## 2020-12-19 MED ADMIN — LIDOCAINE 5 % TP PTMD [80759]: 1 | TOPICAL | @ 13:00:00 | NDC 00591352530

## 2020-12-19 MED ADMIN — TRAZODONE 50 MG PO TAB [8085]: 100 mg | ORAL | @ 01:00:00 | NDC 50111056001

## 2020-12-19 MED ADMIN — TRAMADOL 50 MG PO TAB [14632]: 50 mg | ORAL | @ 22:00:00 | NDC 68084080811

## 2020-12-19 MED ADMIN — TRAMADOL 50 MG PO TAB [14632]: 50 mg | ORAL | @ 11:00:00 | NDC 68084080811

## 2020-12-20 ENCOUNTER — Encounter: Admit: 2020-12-20 | Discharge: 2020-12-20 | Payer: MEDICAID

## 2020-12-20 MED ADMIN — FENTANYL CITRATE (PF) 50 MCG/ML IJ SOLN [3037]: 25 ug | INTRAVENOUS | @ 01:00:00 | NDC 63323080611

## 2020-12-20 MED ADMIN — TRAZODONE 50 MG PO TAB [8085]: 100 mg | ORAL | @ 01:00:00 | NDC 50111056001

## 2020-12-20 MED ADMIN — PANTOPRAZOLE 40 MG PO TBEC [80436]: 40 mg | ORAL | @ 01:00:00 | NDC 00904647461

## 2020-12-20 MED ADMIN — LACTATED RINGERS IV SOLP [4318]: 1000.000 mL | INTRAVENOUS | @ 12:00:00 | Stop: 2020-12-20 | NDC 00338011704

## 2020-12-20 MED ADMIN — TRAMADOL 50 MG PO TAB [14632]: 50 mg | ORAL | @ 15:00:00 | Stop: 2020-12-20 | NDC 68084080811

## 2020-12-20 MED ADMIN — LACTATED RINGERS IV SOLP [4318]: 1000.000 mL | INTRAVENOUS | @ 03:00:00 | NDC 00338011704

## 2020-12-20 MED ADMIN — CEFTRIAXONE INJ 1GM IVP [210253]: 1 g | INTRAVENOUS | @ 12:00:00 | Stop: 2020-12-20 | NDC 00143985701

## 2020-12-20 MED ADMIN — LIDOCAINE 5 % TP PTMD [80759]: 1 | TOPICAL | @ 14:00:00 | Stop: 2020-12-20 | NDC 00591352530

## 2020-12-20 MED ADMIN — TRAMADOL 50 MG PO TAB [14632]: 50 mg | ORAL | @ 06:00:00 | Stop: 2020-12-20 | NDC 68084080811

## 2020-12-20 MED FILL — TRAMADOL 50 MG PO TAB: 50 mg | ORAL | 3 days supply | Qty: 10 | Fill #1 | Status: CP

## 2020-12-20 MED FILL — ONDANSETRON HCL 4 MG PO TAB: 4 mg | ORAL | 5 days supply | Qty: 30 | Fill #1 | Status: CP

## 2020-12-20 MED FILL — SCOPOLAMINE BASE 1 MG OVER 3 DAYS TD PT3D: TRANSDERMAL | 12 days supply | Qty: 4 | Fill #1 | Status: CP

## 2020-12-24 ENCOUNTER — Encounter: Admit: 2020-12-24 | Discharge: 2020-12-24 | Payer: MEDICAID

## 2020-12-24 NOTE — Telephone Encounter
Mychart message sent.

## 2020-12-24 NOTE — Telephone Encounter
Patient called office to update [place] on her recent hospital admission.  Patient was admitted 12/16/2020-12/20/2020 for intractable nausea and vomiting as well as UTI.  Patient states that she is having continuous sharp stomach pains that shoots to her sides.  She is also severely nauseated and having diarrhea.  Patient was started on Scopolamine with minimal relief.  Patient is scheduled for Laparoscopic Repair Paraesophageal Hernia, EGD, and Gastrostomy on 01/22/2021.  Patient unsure what to do as she is having terrible nausea and pain currently.  Please advise.

## 2021-01-06 ENCOUNTER — Encounter: Admit: 2021-01-06 | Discharge: 2021-01-06 | Payer: MEDICAID

## 2021-01-06 ENCOUNTER — Ambulatory Visit: Admit: 2021-01-06 | Discharge: 2021-01-06 | Payer: MEDICAID

## 2021-01-06 DIAGNOSIS — G8929 Other chronic pain: Secondary | ICD-10-CM

## 2021-01-06 DIAGNOSIS — K219 Gastro-esophageal reflux disease without esophagitis: Secondary | ICD-10-CM

## 2021-01-06 DIAGNOSIS — I8500 Esophageal varices without bleeding: Secondary | ICD-10-CM

## 2021-01-06 DIAGNOSIS — F32A Depression: Secondary | ICD-10-CM

## 2021-01-06 DIAGNOSIS — U099 COVID-19 long hauler: Secondary | ICD-10-CM

## 2021-01-06 DIAGNOSIS — N200 Calculus of kidney: Secondary | ICD-10-CM

## 2021-01-06 DIAGNOSIS — R110 Nausea: Secondary | ICD-10-CM

## 2021-01-06 DIAGNOSIS — M1990 Unspecified osteoarthritis, unspecified site: Secondary | ICD-10-CM

## 2021-01-06 DIAGNOSIS — M5136 Other intervertebral disc degeneration, lumbar region: Secondary | ICD-10-CM

## 2021-01-06 DIAGNOSIS — K279 Peptic ulcer, site unspecified, unspecified as acute or chronic, without hemorrhage or perforation: Secondary | ICD-10-CM

## 2021-01-06 DIAGNOSIS — D509 Iron deficiency anemia, unspecified: Secondary | ICD-10-CM

## 2021-01-06 DIAGNOSIS — K449 Diaphragmatic hernia without obstruction or gangrene: Secondary | ICD-10-CM

## 2021-01-06 DIAGNOSIS — S0592XA Unspecified injury of left eye and orbit, initial encounter: Secondary | ICD-10-CM

## 2021-01-06 DIAGNOSIS — F419 Anxiety disorder, unspecified: Secondary | ICD-10-CM

## 2021-01-06 DIAGNOSIS — K769 Liver disease, unspecified: Secondary | ICD-10-CM

## 2021-01-06 DIAGNOSIS — K7460 Unspecified cirrhosis of liver: Secondary | ICD-10-CM

## 2021-01-06 DIAGNOSIS — K209 Esophagitis: Secondary | ICD-10-CM

## 2021-01-06 DIAGNOSIS — J329 Chronic sinusitis, unspecified: Secondary | ICD-10-CM

## 2021-01-06 DIAGNOSIS — I10 Essential (primary) hypertension: Secondary | ICD-10-CM

## 2021-01-06 DIAGNOSIS — E119 Type 2 diabetes mellitus without complications: Secondary | ICD-10-CM

## 2021-01-06 DIAGNOSIS — M5416 Radiculopathy, lumbar region: Secondary | ICD-10-CM

## 2021-01-06 DIAGNOSIS — M9902 Segmental and somatic dysfunction of thoracic region: Secondary | ICD-10-CM

## 2021-01-06 DIAGNOSIS — B1920 Unspecified viral hepatitis C without hepatic coma: Secondary | ICD-10-CM

## 2021-01-06 DIAGNOSIS — Z8679 Personal history of other diseases of the circulatory system: Secondary | ICD-10-CM

## 2021-01-19 ENCOUNTER — Encounter: Admit: 2021-01-19 | Discharge: 2021-01-19 | Payer: MEDICAID

## 2021-01-19 MED ORDER — NABUMETONE 750 MG PO TAB
ORAL_TABLET | Freq: Two times a day (BID) | 1 refills
Start: 2021-01-19 — End: ?

## 2021-01-22 ENCOUNTER — Inpatient Hospital Stay: Admit: 2021-01-22 | Discharge: 2021-01-22 | Payer: MEDICAID

## 2021-01-22 ENCOUNTER — Encounter: Admit: 2021-01-22 | Discharge: 2021-01-22 | Payer: MEDICAID

## 2021-01-22 MED ORDER — PROPOFOL INJ 10 MG/ML IV VIAL
INTRAVENOUS | 0 refills | Status: DC
Start: 2021-01-22 — End: 2021-01-22
  Administered 2021-01-22: 13:00:00 130 mg via INTRAVENOUS

## 2021-01-22 MED ORDER — ARTIFICIAL TEARS (PF) SINGLE DOSE DROPS GROUP
OPHTHALMIC | 0 refills | Status: DC
Start: 2021-01-22 — End: 2021-01-22
  Administered 2021-01-22: 13:00:00 2 [drp] via OPHTHALMIC

## 2021-01-22 MED ORDER — LIDOCAINE (PF) 200 MG/10 ML (2 %) IJ SYRG
INTRAVENOUS | 0 refills | Status: DC
Start: 2021-01-22 — End: 2021-01-22
  Administered 2021-01-22: 13:00:00 80 mg via INTRAVENOUS

## 2021-01-22 MED ORDER — DEXMEDETOMIDINE IN 0.9 % NACL 20 MCG/5 ML (4 MCG/ML) IV SYRG
INTRAVENOUS | 0 refills | Status: DC
Start: 2021-01-22 — End: 2021-01-22
  Administered 2021-01-22 (×5): 4 ug via INTRAVENOUS

## 2021-01-22 MED ORDER — PROPOFOL 10 MG/ML IV EMUL 100 ML (INFUSION)(AM)(OR)
INTRAVENOUS | 0 refills | Status: DC
Start: 2021-01-22 — End: 2021-01-22
  Administered 2021-01-22: 13:00:00 100 ug/kg/min via INTRAVENOUS
  Administered 2021-01-22: 15:00:00 80 ug/kg/min via INTRAVENOUS

## 2021-01-22 MED ORDER — ROCURONIUM 10 MG/ML IV SOLN
INTRAVENOUS | 0 refills | Status: DC
Start: 2021-01-22 — End: 2021-01-22
  Administered 2021-01-22 (×3): 10 mg via INTRAVENOUS
  Administered 2021-01-22: 13:00:00 30 mg via INTRAVENOUS

## 2021-01-22 MED ORDER — FENTANYL CITRATE (PF) 50 MCG/ML IJ SOLN
INTRAVENOUS | 0 refills | Status: DC
Start: 2021-01-22 — End: 2021-01-22
  Administered 2021-01-22 (×2): 50 ug via INTRAVENOUS

## 2021-01-22 MED ORDER — ONDANSETRON HCL (PF) 4 MG/2 ML IJ SOLN
INTRAVENOUS | 0 refills | Status: DC
Start: 2021-01-22 — End: 2021-01-22
  Administered 2021-01-22: 16:00:00 4 mg via INTRAVENOUS

## 2021-01-22 MED ORDER — SUCCINYLCHOLINE CHLORIDE 20 MG/ML IJ SOLN
INTRAVENOUS | 0 refills | Status: DC
Start: 2021-01-22 — End: 2021-01-22
  Administered 2021-01-22: 13:00:00 120 mg via INTRAVENOUS

## 2021-01-22 MED ORDER — MIDAZOLAM 1 MG/ML IJ SOLN
INTRAVENOUS | 0 refills | Status: DC
Start: 2021-01-22 — End: 2021-01-22
  Administered 2021-01-22: 13:00:00 2 mg via INTRAVENOUS

## 2021-01-22 MED ORDER — PHENYLEPHRINE HCL IN 0.9% NACL 1 MG/10 ML (100 MCG/ML) IV SYRG
INTRAVENOUS | 0 refills | Status: DC
Start: 2021-01-22 — End: 2021-01-22
  Administered 2021-01-22 (×7): 100 ug via INTRAVENOUS

## 2021-01-22 MED ORDER — LIDOCAINE IN 5 % DEXTROSE (PF) 8 MG/ML (0.8 %) IV SOLP (INFUSION)(AM)(OR)
INTRAVENOUS | 0 refills | Status: DC
Start: 2021-01-22 — End: 2021-01-22
  Administered 2021-01-22: 13:00:00 25 ug/kg/min via INTRAVENOUS

## 2021-01-22 MED ORDER — SUGAMMADEX 100 MG/ML IV SOLN
INTRAVENOUS | 0 refills | Status: DC
Start: 2021-01-22 — End: 2021-01-22
  Administered 2021-01-22: 16:00:00 400 mg via INTRAVENOUS

## 2021-01-22 MED ADMIN — FENTANYL CITRATE (PF) 50 MCG/ML IJ SOLN [3037]: 12.5 ug | INTRAVENOUS | @ 17:00:00 | Stop: 2021-01-22 | NDC 00409909412

## 2021-01-22 MED ADMIN — KETOROLAC 30 MG/ML (1 ML) IJ SOLN [22473]: 15 mg | INTRAVENOUS | @ 17:00:00 | Stop: 2021-01-22 | NDC 72611072201

## 2021-01-22 MED ADMIN — LIDOCAINE 5 % TP PTMD [80759]: 1 | TOPICAL | @ 17:00:00 | Stop: 2021-01-23 | NDC 00591352530

## 2021-01-22 MED ADMIN — LACTATED RINGERS IV SOLP [4318]: 1000 mL | INTRAVENOUS | @ 12:00:00 | Stop: 2021-01-22 | NDC 00338011704

## 2021-01-22 MED ADMIN — ACETAMINOPHEN 1,000 MG/100 ML (10 MG/ML) IV SOLN [305632]: 1000 mg | INTRAVENOUS | @ 12:00:00 | Stop: 2021-01-22 | NDC 67457094010

## 2021-01-22 MED ADMIN — CYCLOBENZAPRINE 5 MG PO TAB [35184]: 5 mg | ORAL | @ 20:00:00 | NDC 68084075395

## 2021-01-22 MED ADMIN — ONDANSETRON HCL (PF) 4 MG/2 ML IJ SOLN [136012]: 4 mg | INTRAVENOUS | @ 20:00:00 | NDC 36000001225

## 2021-01-22 MED ADMIN — FAMOTIDINE (PF) 20 MG/2 ML IV SOLN [166077]: 20 mg | INTRAVENOUS | @ 12:00:00 | Stop: 2021-01-22 | NDC 70860075141

## 2021-01-22 MED ADMIN — DEXTROSE 5% IN WATER IV SOLP [2364]: 750 mg | INTRAVENOUS | @ 18:00:00 | Stop: 2021-01-22 | NDC 00338001738

## 2021-01-22 MED ADMIN — CEFAZOLIN 1 GRAM IJ SOLR [1445]: 2 g | INTRAVENOUS | @ 13:00:00 | Stop: 2021-01-22 | NDC 00409080501

## 2021-01-22 MED ADMIN — BUPIVACAINE-EPINEPHRINE 0.25 %-1:200,000 IJ SOLN [14983]: 11 mL | INTRAMUSCULAR | @ 14:00:00 | Stop: 2021-01-22 | NDC 63323046101

## 2021-01-22 MED ADMIN — ACETAMINOPHEN 325 MG PO TAB [101]: 650 mg | ORAL | @ 20:00:00 | NDC 00904677361

## 2021-01-22 MED ADMIN — METHOCARBAMOL 100 MG/ML IJ SOLN [4970]: 750 mg | INTRAVENOUS | @ 18:00:00 | Stop: 2021-01-22 | NDC 00641610301

## 2021-01-22 MED ADMIN — OXYCODONE 5 MG PO TAB [10814]: 5 mg | ORAL | @ 20:00:00 | NDC 00904696661

## 2021-01-22 MED ADMIN — LACTATED RINGERS IV SOLP [4318]: 1000.000 mL | INTRAVENOUS | @ 14:00:00 | Stop: 2021-01-22 | NDC 00338011704

## 2021-01-22 MED ADMIN — LACTATED RINGERS IV SOLP [4318]: 1000.000 mL | INTRAVENOUS | @ 19:00:00 | Stop: 2021-01-24 | NDC 00338011704

## 2021-01-22 MED ADMIN — OXYCODONE 5 MG PO TAB [10814]: 5 mg | ORAL | @ 19:00:00 | Stop: 2021-01-22 | NDC 00904696661

## 2021-01-22 MED ADMIN — FENTANYL CITRATE (PF) 50 MCG/ML IJ SOLN [3037]: 12.5 ug | INTRAVENOUS | @ 16:00:00 | Stop: 2021-01-22 | NDC 00409909412

## 2021-01-23 ENCOUNTER — Inpatient Hospital Stay: Admit: 2021-01-23 | Discharge: 2021-01-23 | Payer: MEDICAID

## 2021-01-23 MED ADMIN — OXYCODONE 5 MG PO TAB [10814]: 5 mg | ORAL | @ 22:00:00 | NDC 00904696661

## 2021-01-23 MED ADMIN — LACTATED RINGERS IV SOLP [4318]: 1000.000 mL | INTRAVENOUS | @ 03:00:00 | Stop: 2021-01-24 | NDC 00338011704

## 2021-01-23 MED ADMIN — ACETAMINOPHEN 325 MG PO TAB [101]: 650 mg | ORAL | @ 22:00:00 | NDC 00904677361

## 2021-01-23 MED ADMIN — OXYCODONE 5 MG PO TAB [10814]: 5 mg | ORAL | @ 18:00:00 | NDC 00904696661

## 2021-01-23 MED ADMIN — OXYCODONE 5 MG PO TAB [10814]: 5 mg | ORAL | @ 13:00:00 | NDC 00904696661

## 2021-01-23 MED ADMIN — ACETAMINOPHEN 325 MG PO TAB [101]: 650 mg | ORAL | @ 09:00:00 | NDC 00904677361

## 2021-01-23 MED ADMIN — ACETAMINOPHEN 325 MG PO TAB [101]: 650 mg | ORAL | @ 02:00:00 | NDC 00904677361

## 2021-01-23 MED ADMIN — OXYCODONE 5 MG PO TAB [10814]: 5 mg | ORAL | @ 02:00:00 | NDC 00904696661

## 2021-01-23 MED ADMIN — ONDANSETRON HCL (PF) 4 MG/2 ML IJ SOLN [136012]: 4 mg | INTRAVENOUS | @ 03:00:00 | NDC 36000001225

## 2021-01-23 MED ADMIN — VENLAFAXINE 150 MG PO CP24 [78649]: 150 mg | ORAL | @ 13:00:00 | NDC 68084071311

## 2021-01-23 MED ADMIN — ACETAMINOPHEN 325 MG PO TAB [101]: 650 mg | ORAL | @ 13:00:00 | NDC 00904677361

## 2021-01-23 MED ADMIN — ENOXAPARIN 40 MG/0.4 ML SC SYRG [85052]: 40 mg | SUBCUTANEOUS | @ 02:00:00 | NDC 00781324602

## 2021-01-23 MED ADMIN — PANTOPRAZOLE 40 MG PO TBEC [80436]: 40 mg | ORAL | @ 02:00:00 | NDC 00904647461

## 2021-01-23 MED ADMIN — CYCLOBENZAPRINE 5 MG PO TAB [35184]: 5 mg | ORAL | @ 18:00:00 | NDC 68084075395

## 2021-01-23 MED ADMIN — TRAZODONE 100 MG PO TAB [8083]: 100 mg | ORAL | @ 02:00:00 | NDC 00904686961

## 2021-01-23 MED ADMIN — MAGNESIUM SULFATE IN D5W 1 GRAM/100 ML IV PGBK [166578]: 1 g | INTRAVENOUS | @ 18:00:00 | Stop: 2021-01-23 | NDC 44567041024

## 2021-01-23 MED ADMIN — OXYCODONE 5 MG PO TAB [10814]: 5 mg | ORAL | @ 09:00:00 | NDC 00904696661

## 2021-01-23 MED ADMIN — BENZONATATE 100 MG PO CAP [988]: 100 mg | ORAL | @ 18:00:00 | NDC 00904656461

## 2021-01-23 MED ADMIN — LACTATED RINGERS IV SOLP [4318]: 1000.000 mL | INTRAVENOUS | @ 16:00:00 | Stop: 2021-01-24 | NDC 00338011704

## 2021-01-23 MED ADMIN — MAGNESIUM SULFATE IN D5W 1 GRAM/100 ML IV PGBK [166578]: 1 g | INTRAVENOUS | @ 21:00:00 | Stop: 2021-01-23 | NDC 00338170940

## 2021-01-24 ENCOUNTER — Encounter: Admit: 2021-01-24 | Discharge: 2021-01-24 | Payer: MEDICAID

## 2021-01-24 DIAGNOSIS — K449 Diaphragmatic hernia without obstruction or gangrene: Secondary | ICD-10-CM

## 2021-01-24 DIAGNOSIS — I8500 Esophageal varices without bleeding: Secondary | ICD-10-CM

## 2021-01-24 DIAGNOSIS — K769 Liver disease, unspecified: Secondary | ICD-10-CM

## 2021-01-24 DIAGNOSIS — D509 Iron deficiency anemia, unspecified: Secondary | ICD-10-CM

## 2021-01-24 DIAGNOSIS — K7460 Unspecified cirrhosis of liver: Secondary | ICD-10-CM

## 2021-01-24 DIAGNOSIS — K219 Gastro-esophageal reflux disease without esophagitis: Secondary | ICD-10-CM

## 2021-01-24 DIAGNOSIS — B1920 Unspecified viral hepatitis C without hepatic coma: Secondary | ICD-10-CM

## 2021-01-24 DIAGNOSIS — I10 Essential (primary) hypertension: Secondary | ICD-10-CM

## 2021-01-24 DIAGNOSIS — G8929 Other chronic pain: Secondary | ICD-10-CM

## 2021-01-24 DIAGNOSIS — F419 Anxiety disorder, unspecified: Secondary | ICD-10-CM

## 2021-01-24 DIAGNOSIS — S0592XA Unspecified injury of left eye and orbit, initial encounter: Secondary | ICD-10-CM

## 2021-01-24 DIAGNOSIS — R110 Nausea: Secondary | ICD-10-CM

## 2021-01-24 DIAGNOSIS — U099 COVID-19 long hauler: Secondary | ICD-10-CM

## 2021-01-24 DIAGNOSIS — M9902 Segmental and somatic dysfunction of thoracic region: Secondary | ICD-10-CM

## 2021-01-24 DIAGNOSIS — N200 Calculus of kidney: Secondary | ICD-10-CM

## 2021-01-24 DIAGNOSIS — K209 Esophagitis: Secondary | ICD-10-CM

## 2021-01-24 DIAGNOSIS — E119 Type 2 diabetes mellitus without complications: Secondary | ICD-10-CM

## 2021-01-24 DIAGNOSIS — J329 Chronic sinusitis, unspecified: Secondary | ICD-10-CM

## 2021-01-24 DIAGNOSIS — Z8679 Personal history of other diseases of the circulatory system: Secondary | ICD-10-CM

## 2021-01-24 DIAGNOSIS — M5136 Other intervertebral disc degeneration, lumbar region: Secondary | ICD-10-CM

## 2021-01-24 DIAGNOSIS — M1990 Unspecified osteoarthritis, unspecified site: Secondary | ICD-10-CM

## 2021-01-24 DIAGNOSIS — K279 Peptic ulcer, site unspecified, unspecified as acute or chronic, without hemorrhage or perforation: Secondary | ICD-10-CM

## 2021-01-24 DIAGNOSIS — M5416 Radiculopathy, lumbar region: Secondary | ICD-10-CM

## 2021-01-24 DIAGNOSIS — F32A Depression: Secondary | ICD-10-CM

## 2021-01-24 MED ADMIN — LIDOCAINE 5 % TP PTMD [80759]: 2 | TOPICAL | @ 13:00:00 | NDC 00591352530

## 2021-01-24 MED ADMIN — DEXTROSE 5% IN WATER IV SOLP [2364]: 1000 mg | INTRAVENOUS | @ 13:00:00 | Stop: 2021-01-24 | NDC 00338001738

## 2021-01-24 MED ADMIN — PANTOPRAZOLE 40 MG PO TBEC [80436]: 40 mg | ORAL | @ 16:00:00 | NDC 00904647461

## 2021-01-24 MED ADMIN — ENOXAPARIN 40 MG/0.4 ML SC SYRG [85052]: 40 mg | SUBCUTANEOUS | @ 03:00:00 | NDC 00781324602

## 2021-01-24 MED ADMIN — BENZONATATE 100 MG PO CAP [988]: 100 mg | ORAL | @ 22:00:00 | NDC 00904656461

## 2021-01-24 MED ADMIN — OXYCODONE 5 MG PO TAB [10814]: 10 mg | ORAL | @ 22:00:00 | NDC 00904696661

## 2021-01-24 MED ADMIN — LACTATED RINGERS IV SOLP [4318]: 1000.000 mL | INTRAVENOUS | @ 10:00:00 | Stop: 2021-01-26 | NDC 00338011704

## 2021-01-24 MED ADMIN — ACETAMINOPHEN 325 MG PO TAB [101]: 650 mg | ORAL | @ 21:00:00 | NDC 00904677361

## 2021-01-24 MED ADMIN — POLYETHYLENE GLYCOL 3350 17 GRAM PO PWPK [25424]: 17 g | ORAL | @ 13:00:00 | NDC 00904693186

## 2021-01-24 MED ADMIN — OXYCODONE 5 MG PO TAB [10814]: 5 mg | ORAL | @ 11:00:00 | NDC 00904696661

## 2021-01-24 MED ADMIN — TRAZODONE 100 MG PO TAB [8083]: 100 mg | ORAL | @ 03:00:00 | NDC 00904686961

## 2021-01-24 MED ADMIN — DOCUSATE SODIUM 50 MG/5 ML PO LIQD [2569]: 100 mg | ORAL | @ 13:00:00 | NDC 00121187010

## 2021-01-24 MED ADMIN — OXYCODONE 10 MG PO TAB [166908]: 10 mg | ORAL | @ 03:00:00 | NDC 68084096811

## 2021-01-24 MED ADMIN — ACETAMINOPHEN 325 MG PO TAB [101]: 650 mg | ORAL | @ 13:00:00 | NDC 00904677361

## 2021-01-24 MED ADMIN — ONDANSETRON HCL (PF) 4 MG/2 ML IJ SOLN [136012]: 4 mg | INTRAVENOUS | @ 11:00:00 | NDC 36000001225

## 2021-01-24 MED ADMIN — ACETAMINOPHEN 325 MG PO TAB [101]: 650 mg | ORAL | @ 03:00:00 | NDC 00904677361

## 2021-01-24 MED ADMIN — OXYCODONE 5 MG PO TAB [10814]: 5 mg | ORAL | @ 12:00:00 | NDC 00904696661

## 2021-01-24 MED ADMIN — OXYCODONE 5 MG PO TAB [10814]: 10 mg | ORAL | @ 18:00:00 | NDC 00904696661

## 2021-01-24 MED ADMIN — BENZONATATE 100 MG PO CAP [988]: 100 mg | ORAL | @ 13:00:00 | NDC 00904656461

## 2021-01-24 MED ADMIN — VENLAFAXINE 150 MG PO CP24 [78649]: 150 mg | ORAL | @ 13:00:00 | NDC 68084071311

## 2021-01-24 MED ADMIN — LACTATED RINGERS IV SOLP [4318]: 1000.000 mL | INTRAVENOUS | @ 22:00:00 | Stop: 2021-01-26 | NDC 00338011704

## 2021-01-24 MED ADMIN — METHOCARBAMOL 100 MG/ML IJ SOLN [4970]: 1000 mg | INTRAVENOUS | @ 13:00:00 | Stop: 2021-01-24 | NDC 55150022310

## 2021-01-24 MED ADMIN — PANTOPRAZOLE 40 MG PO TBEC [80436]: 40 mg | ORAL | @ 03:00:00 | NDC 00904647461

## 2021-01-25 ENCOUNTER — Encounter: Admit: 2021-01-25 | Discharge: 2021-01-25 | Payer: MEDICAID

## 2021-01-25 MED ADMIN — METHOCARBAMOL 750 MG PO TAB [4972]: 750 mg | ORAL | @ 04:00:00 | NDC 00904705861

## 2021-01-25 MED ADMIN — ENOXAPARIN 40 MG/0.4 ML SC SYRG [85052]: 40 mg | SUBCUTANEOUS | @ 04:00:00 | NDC 00781324602

## 2021-01-25 MED ADMIN — ACETAMINOPHEN 325 MG PO TAB [101]: 650 mg | ORAL | @ 13:00:00 | Stop: 2021-01-25 | NDC 00904677361

## 2021-01-25 MED ADMIN — METHOCARBAMOL 750 MG PO TAB [4972]: 750 mg | ORAL | @ 13:00:00 | Stop: 2021-01-25 | NDC 00904705861

## 2021-01-25 MED ADMIN — VENLAFAXINE 150 MG PO CP24 [78649]: 150 mg | ORAL | @ 13:00:00 | Stop: 2021-01-25 | NDC 68084071311

## 2021-01-25 MED ADMIN — TRAZODONE 100 MG PO TAB [8083]: 100 mg | ORAL | @ 04:00:00 | NDC 00904686961

## 2021-01-25 MED ADMIN — ACETAMINOPHEN 325 MG PO TAB [101]: 650 mg | ORAL | @ 04:00:00 | NDC 00904677361

## 2021-01-25 MED ADMIN — LIDOCAINE 5 % TP PTMD [80759]: 2 | TOPICAL | @ 13:00:00 | Stop: 2021-01-25 | NDC 00591352530

## 2021-01-25 MED ADMIN — DOCUSATE SODIUM 50 MG/5 ML PO LIQD [2569]: 100 mg | ORAL | @ 04:00:00 | NDC 00121187010

## 2021-01-25 MED ADMIN — POLYETHYLENE GLYCOL 3350 17 GRAM PO PWPK [25424]: 17 g | ORAL | @ 13:00:00 | Stop: 2021-01-25 | NDC 00904693186

## 2021-01-25 MED ADMIN — PANTOPRAZOLE 40 MG PO TBEC [80436]: 40 mg | ORAL | @ 04:00:00 | NDC 00904647461

## 2021-01-25 MED FILL — LIDOCAINE 5 % TP PTMD: 5 % | TOPICAL | 7 days supply | Qty: 15 | Fill #1 | Status: CP

## 2021-01-25 MED FILL — BENZONATATE 100 MG PO CAP: 100 mg | ORAL | 7 days supply | Qty: 20 | Fill #1 | Status: CP

## 2021-01-25 MED FILL — ONDANSETRON 4 MG PO TBDI: 4 mg | ORAL | 8 days supply | Qty: 30 | Fill #1 | Status: CP

## 2021-01-25 MED FILL — POLYETHYLENE GLYCOL 3350 17 GRAM PO PWPK: 17 g | ORAL | 12 days supply | Qty: 12 | Fill #1 | Status: CP

## 2021-01-25 MED FILL — METHOCARBAMOL 750 MG PO TAB: 750 mg | ORAL | 8 days supply | Qty: 15 | Fill #1 | Status: CP

## 2021-01-25 MED FILL — DOCUSATE SODIUM 50 MG/5 ML PO LIQD: 50 mg/5 mL | ORAL | 10 days supply | Qty: 200 | Fill #1 | Status: CP

## 2021-01-25 MED FILL — OXYCODONE 5 MG PO TAB: 5 mg | ORAL | 3 days supply | Qty: 25 | Fill #1 | Status: CP

## 2021-01-25 MED FILL — ACETAMINOPHEN 325 MG PO TAB: 325 mg | ORAL | 9 days supply | Qty: 50 | Fill #1 | Status: CP

## 2021-01-29 ENCOUNTER — Encounter: Admit: 2021-01-29 | Discharge: 2021-01-29 | Payer: MEDICAID

## 2021-01-29 ENCOUNTER — Ambulatory Visit: Admit: 2021-01-29 | Discharge: 2021-01-29 | Payer: MEDICAID

## 2021-01-29 DIAGNOSIS — Z8679 Personal history of other diseases of the circulatory system: Secondary | ICD-10-CM

## 2021-01-29 DIAGNOSIS — J329 Chronic sinusitis, unspecified: Secondary | ICD-10-CM

## 2021-01-29 DIAGNOSIS — K449 Diaphragmatic hernia without obstruction or gangrene: Secondary | ICD-10-CM

## 2021-01-29 DIAGNOSIS — M5136 Other intervertebral disc degeneration, lumbar region: Secondary | ICD-10-CM

## 2021-01-29 DIAGNOSIS — K219 Gastro-esophageal reflux disease without esophagitis: Secondary | ICD-10-CM

## 2021-01-29 DIAGNOSIS — B1920 Unspecified viral hepatitis C without hepatic coma: Secondary | ICD-10-CM

## 2021-01-29 DIAGNOSIS — F419 Anxiety disorder, unspecified: Secondary | ICD-10-CM

## 2021-01-29 DIAGNOSIS — M9902 Segmental and somatic dysfunction of thoracic region: Secondary | ICD-10-CM

## 2021-01-29 DIAGNOSIS — S0592XA Unspecified injury of left eye and orbit, initial encounter: Secondary | ICD-10-CM

## 2021-01-29 DIAGNOSIS — K209 Esophagitis: Secondary | ICD-10-CM

## 2021-01-29 DIAGNOSIS — M5416 Radiculopathy, lumbar region: Secondary | ICD-10-CM

## 2021-01-29 DIAGNOSIS — K769 Liver disease, unspecified: Secondary | ICD-10-CM

## 2021-01-29 DIAGNOSIS — G8929 Other chronic pain: Secondary | ICD-10-CM

## 2021-01-29 DIAGNOSIS — R110 Nausea: Secondary | ICD-10-CM

## 2021-01-29 DIAGNOSIS — Z9889 Other specified postprocedural states: Secondary | ICD-10-CM

## 2021-01-29 DIAGNOSIS — D509 Iron deficiency anemia, unspecified: Secondary | ICD-10-CM

## 2021-01-29 DIAGNOSIS — F32A Depression: Secondary | ICD-10-CM

## 2021-01-29 DIAGNOSIS — I10 Essential (primary) hypertension: Secondary | ICD-10-CM

## 2021-01-29 DIAGNOSIS — E119 Type 2 diabetes mellitus without complications: Secondary | ICD-10-CM

## 2021-01-29 DIAGNOSIS — K279 Peptic ulcer, site unspecified, unspecified as acute or chronic, without hemorrhage or perforation: Secondary | ICD-10-CM

## 2021-01-29 DIAGNOSIS — K7460 Unspecified cirrhosis of liver: Secondary | ICD-10-CM

## 2021-01-29 DIAGNOSIS — U099 COVID-19 long hauler: Secondary | ICD-10-CM

## 2021-01-29 DIAGNOSIS — N200 Calculus of kidney: Secondary | ICD-10-CM

## 2021-01-29 DIAGNOSIS — M1990 Unspecified osteoarthritis, unspecified site: Secondary | ICD-10-CM

## 2021-01-29 DIAGNOSIS — I8500 Esophageal varices without bleeding: Secondary | ICD-10-CM

## 2021-01-29 NOTE — Progress Notes
CC: Paraesophageal hernia   Initial post-op appointment status post  Lap recurrent paraesophageal hernia repair with mesh, Nissen fundoplication    Subjective:   Erin Sanchez is a 60 y.o. status post above.  Patient has home oxygen from prior long-haul COVID syndrome.  Used this the first two nights at home and then has not required since.  Has pulse oximeter to monitor her O2 requirements.  Not requiring pain medication and states she is surprised at how well she has recovered.  Tolerating liquids- Jello, broth, pudding.  Has not tried protein shakes.  Is tolerating cream of wheat.       PE:   Vitals:    01/29/21 1443   BP: 128/75   BP Source: Arm, Right Upper   Pulse: 96   Temp: 36.3 C (97.3 F)   SpO2: 97%   TempSrc: Temporal   PainSc: Zero   Weight: 87.1 kg (192 lb)   Height: 167.6 cm (5\' 6" )     Body mass index is 30.99 kg/m.    Abdomen - soft, nondistended, incision sites c/d/i, no evidence of hernia, mild erythema at all sites- no fluctuance        Body mass index is 30.99 kg/m.      A/P:  60 y.o. female s/p lap recurrent paraesophageal hernia repair with mesh, nissen fundoplication, doing well.      Kritika will continue with post-nissen diet progression.  De-escalate PPI to once daily.  RTC 4 weeks.  Will plan to discontinue PPI if not having any GERD symptoms at that time.      She will continue to advance her activity being mindful of lifting restrictions.    She will return for her 6 week follow up appointment.

## 2021-02-17 ENCOUNTER — Encounter: Admit: 2021-02-17 | Discharge: 2021-02-17 | Payer: MEDICAID

## 2021-02-17 ENCOUNTER — Ambulatory Visit: Admit: 2021-02-17 | Discharge: 2021-02-17 | Payer: MEDICAID

## 2021-02-17 DIAGNOSIS — M5416 Radiculopathy, lumbar region: Secondary | ICD-10-CM

## 2021-02-17 DIAGNOSIS — S0592XA Unspecified injury of left eye and orbit, initial encounter: Secondary | ICD-10-CM

## 2021-02-17 DIAGNOSIS — K209 Esophagitis: Secondary | ICD-10-CM

## 2021-02-17 DIAGNOSIS — F419 Anxiety disorder, unspecified: Secondary | ICD-10-CM

## 2021-02-17 DIAGNOSIS — B1920 Unspecified viral hepatitis C without hepatic coma: Secondary | ICD-10-CM

## 2021-02-17 DIAGNOSIS — U099 COVID-19 long hauler: Secondary | ICD-10-CM

## 2021-02-17 DIAGNOSIS — E119 Type 2 diabetes mellitus without complications: Secondary | ICD-10-CM

## 2021-02-17 DIAGNOSIS — I8500 Esophageal varices without bleeding: Secondary | ICD-10-CM

## 2021-02-17 DIAGNOSIS — K449 Diaphragmatic hernia without obstruction or gangrene: Secondary | ICD-10-CM

## 2021-02-17 DIAGNOSIS — Z8679 Personal history of other diseases of the circulatory system: Secondary | ICD-10-CM

## 2021-02-17 DIAGNOSIS — N200 Calculus of kidney: Secondary | ICD-10-CM

## 2021-02-17 DIAGNOSIS — I10 Essential (primary) hypertension: Secondary | ICD-10-CM

## 2021-02-17 DIAGNOSIS — F32A Depression: Secondary | ICD-10-CM

## 2021-02-17 DIAGNOSIS — R110 Nausea: Secondary | ICD-10-CM

## 2021-02-17 DIAGNOSIS — M1990 Unspecified osteoarthritis, unspecified site: Secondary | ICD-10-CM

## 2021-02-17 DIAGNOSIS — M5136 Other intervertebral disc degeneration, lumbar region: Secondary | ICD-10-CM

## 2021-02-17 DIAGNOSIS — K7460 Unspecified cirrhosis of liver: Secondary | ICD-10-CM

## 2021-02-17 DIAGNOSIS — K219 Gastro-esophageal reflux disease without esophagitis: Secondary | ICD-10-CM

## 2021-02-17 DIAGNOSIS — D509 Iron deficiency anemia, unspecified: Secondary | ICD-10-CM

## 2021-02-17 DIAGNOSIS — G8929 Other chronic pain: Secondary | ICD-10-CM

## 2021-02-17 DIAGNOSIS — M461 Sacroiliitis, not elsewhere classified: Secondary | ICD-10-CM

## 2021-02-17 DIAGNOSIS — M9902 Segmental and somatic dysfunction of thoracic region: Secondary | ICD-10-CM

## 2021-02-17 DIAGNOSIS — J329 Chronic sinusitis, unspecified: Secondary | ICD-10-CM

## 2021-02-17 DIAGNOSIS — K769 Liver disease, unspecified: Secondary | ICD-10-CM

## 2021-02-17 DIAGNOSIS — K279 Peptic ulcer, site unspecified, unspecified as acute or chronic, without hemorrhage or perforation: Secondary | ICD-10-CM

## 2021-02-17 DIAGNOSIS — M25559 Pain in unspecified hip: Secondary | ICD-10-CM

## 2021-02-17 NOTE — Progress Notes
[facility] CLINIC NOTE       SUBJECTIVE:  60 year old female presenting for follow-up from last visit on 11/12/2020.  At that time we did bilateral SI joint injection to bilateral greater trochanteric bursa injections.  She reports very good relief from this, greater than 80%, but it was clouded by the fact that she had a very large hiatal hernia which needed surgery.  Prior to the surgery she was getting very nauseous and throwing up all the time and thinks that exacerbated her back and hip pain.  She is interested in repeating the injections.  She reports pain in similar areas in the SI joints and bilateral lateral aspects of her hips.  Denies any pain numbness and tingling going down legs.         Review of Systems   Constitutional: Negative.    HENT: Negative.    Eyes: Positive for visual disturbance.   Respiratory: Negative.    Cardiovascular: Negative.    Gastrointestinal: Negative.    Endocrine: Negative.    Genitourinary: Negative.    Musculoskeletal: Negative.    Skin: Negative.    Allergic/Immunologic: Negative.    Neurological: Negative.    Hematological: Negative.    Psychiatric/Behavioral: Negative.        Current Outpatient Medications:   ?  acetaminophen (TYLENOL) 325 mg tablet, Take two tablets by mouth every 6 hours while awake., Disp: 50 tablet, Rfl: 0  ?  albuterol sulfate (PROAIR HFA) 90 mcg/actuation HFA aerosol inhaler, Inhale 2 puffs by mouth into the lungs daily as needed for Wheezing or Shortness of Breath. Shake well before use., Disp: , Rfl:   ?  benzonatate (TESSALON PERLES) 100 mg capsule, Take one capsule by mouth three times daily as needed for Cough., Disp: 20 capsule, Rfl: 0  ?  diclofenac sodium (VOLTAREN) 1 % topical gel, Apply four g topically to affected area four times daily. (Patient taking differently: Apply 4 g topically to affected area four times daily as needed.), Disp: 300 g, Rfl: 1  ?  docusate sodium (COLACE) 50 mg/5 mL oral solution, Take 10 mL by mouth twice daily., Disp: 200 mL, Rfl: 1  ?  lidocaine (LIDODERM) 5 % topical patch, Apply two patches topically to affected area daily. Apply patch for 12 hours, then remove for 12 hours before repeating., Disp: 15 patch, Rfl: 0  ?  metFORMIN-XR (GLUCOPHAGE XR) 500 mg extended release tablet, Take 2 tablets by mouth daily., Disp: , Rfl:   ?  methocarbamoL (ROBAXIN) 750 mg tablet, Take one tablet by mouth twice daily as needed for Muscle Cramps., Disp: 15 tablet, Rfl: 0  ?  nabumetone (RELAFEN) 750 mg tablet, TAKE 1 TABLET BY MOUTH TWICE A DAY, Disp: 60 tablet, Rfl: 0  ?  omeprazole DR (PRILOSEC) 40 mg capsule, TAKE 1 CAPSULE BY MOUTH TWICE A DAY BEFORE MEALS, Disp: 180 capsule, Rfl: 1  ?  ondansetron (ZOFRAN ODT) 4 mg rapid dissolve tablet, Dissolve one tablet by mouth every 6 hours as needed. Place on tongue to dissolve., Disp: 30 tablet, Rfl: 0  ?  ondansetron HCL (ZOFRAN) 4 mg tablet, Take one tablet to two tablets by mouth every 8 hours as needed., Disp: 30 tablet, Rfl: 0  ?  oxyCODONE (ROXICODONE) 5 mg tablet, Take one tablet to two tablets by mouth every 6 hours as needed., Disp: 25 tablet, Rfl: 0  ?  polyethylene glycol 3350 (MIRALAX) 17 g packet, Take one packet by mouth daily., Disp: 12 packet, Rfl: 0  ?  potassium chloride SR (K-DUR) 20 mEq tablet, Take 20 mEq by mouth twice daily., Disp: , Rfl:   ?  scopolamine (TRANSDERM-SCOP) 1mg  over 3 days 3 day patch, Apply one patch to top of skin as directed every 72 hours., Disp: 4 patch, Rfl: 0  ?  traZODone (DESYREL) 50 mg tablet, Take 100 mg by mouth at bedtime daily., Disp: , Rfl:   ?  valsartan (DIOVAN) 80 mg tablet, Take 80 mg by mouth daily., Disp: , Rfl:   ?  venlafaxine XR (EFFEXOR XR) 150 mg capsule, Take 150 mg by mouth daily., Disp: , Rfl:   Allergies   Allergen Reactions   ? Codeine ITCHING   ? Ciprofloxacin UNKNOWN     Used for an eye infection, caused patient to be very ill.     Physical Exam  Vitals:    02/17/21 1515   BP: (!) 152/108   Pulse: 90   SpO2: 98% PainSc: Five   Weight: 87.1 kg (192 lb)   Height: 172.7 cm (5' 8)     Oswestry Total Score:: 28  Pain Score: Five  Body mass index is 29.19 kg/m?Marland Kitchen  Physical Exam ?  Gen: comfortable, NAD ?  HEENT: NCAT, anicteric sclera ?  Card: Extremities warm, well-perfused, cap refill <2sec ?  Pulm: no distress, not cyanotic ?  Abd: soft, non-distended ?  Skin: Skin is warm and dry.  Psychiatric: normal mood and affect. Behavior is normal.     Neuro ?  CNII-XII grossly normal ?  Mental status appropriate     MSK: ?  Inspection: grossly symmetric, no obvious deformity, no erythema ?  Palpation: Tender palpation bilateral SI joints, bilateral greater trochanteric bursa?  Maneuvers: Patrick's, Gaenslen's, SI compression all positive bilaterally             IMPRESSION:  1. Sacroiliitis (HCC)    2. Greater trochanteric pain syndrome          PLAN: Scheduling bilateral sacroiliac joint injection with bilateral greater trochanteric bursa injection to be done at the same time.  She was encouraged to continue exercises in the meantime.

## 2021-02-25 ENCOUNTER — Encounter: Admit: 2021-02-25 | Discharge: 2021-02-25 | Payer: MEDICAID

## 2021-02-25 MED ORDER — NABUMETONE 750 MG PO TAB
ORAL_TABLET | Freq: Two times a day (BID) | 0 refills
Start: 2021-02-25 — End: ?

## 2021-03-06 ENCOUNTER — Encounter: Admit: 2021-03-06 | Discharge: 2021-03-06 | Payer: MEDICAID

## 2021-03-07 ENCOUNTER — Encounter: Admit: 2021-03-07 | Discharge: 2021-03-07 | Payer: MEDICAID

## 2021-03-12 ENCOUNTER — Encounter: Admit: 2021-03-12 | Discharge: 2021-03-12 | Payer: MEDICAID

## 2021-03-12 NOTE — Telephone Encounter
Called patient to reschedule appt left vm

## 2021-03-18 ENCOUNTER — Encounter: Admit: 2021-03-18 | Discharge: 2021-03-18 | Payer: MEDICAID

## 2021-03-18 DIAGNOSIS — M25559 Pain in unspecified hip: Secondary | ICD-10-CM

## 2021-03-18 DIAGNOSIS — M461 Sacroiliitis, not elsewhere classified: Secondary | ICD-10-CM

## 2021-03-18 DIAGNOSIS — I10 Essential (primary) hypertension: Secondary | ICD-10-CM

## 2021-03-19 ENCOUNTER — Encounter: Admit: 2021-03-19 | Discharge: 2021-03-19 | Payer: MEDICAID

## 2021-03-19 ENCOUNTER — Ambulatory Visit: Admit: 2021-03-19 | Discharge: 2021-03-19 | Payer: MEDICAID

## 2021-03-19 DIAGNOSIS — M461 Sacroiliitis, not elsewhere classified: Secondary | ICD-10-CM

## 2021-03-19 DIAGNOSIS — M5136 Other intervertebral disc degeneration, lumbar region: Secondary | ICD-10-CM

## 2021-03-19 DIAGNOSIS — K449 Diaphragmatic hernia without obstruction or gangrene: Secondary | ICD-10-CM

## 2021-03-19 DIAGNOSIS — M25559 Pain in unspecified hip: Secondary | ICD-10-CM

## 2021-03-19 DIAGNOSIS — R110 Nausea: Secondary | ICD-10-CM

## 2021-03-19 DIAGNOSIS — M9902 Segmental and somatic dysfunction of thoracic region: Secondary | ICD-10-CM

## 2021-03-19 DIAGNOSIS — K7460 Unspecified cirrhosis of liver: Secondary | ICD-10-CM

## 2021-03-19 DIAGNOSIS — B1920 Unspecified viral hepatitis C without hepatic coma: Secondary | ICD-10-CM

## 2021-03-19 DIAGNOSIS — M5416 Radiculopathy, lumbar region: Secondary | ICD-10-CM

## 2021-03-19 DIAGNOSIS — K219 Gastro-esophageal reflux disease without esophagitis: Secondary | ICD-10-CM

## 2021-03-19 DIAGNOSIS — K209 Esophagitis: Secondary | ICD-10-CM

## 2021-03-19 DIAGNOSIS — I10 Essential (primary) hypertension: Secondary | ICD-10-CM

## 2021-03-19 DIAGNOSIS — S0592XA Unspecified injury of left eye and orbit, initial encounter: Secondary | ICD-10-CM

## 2021-03-19 DIAGNOSIS — D509 Iron deficiency anemia, unspecified: Secondary | ICD-10-CM

## 2021-03-19 DIAGNOSIS — F419 Anxiety disorder, unspecified: Secondary | ICD-10-CM

## 2021-03-19 DIAGNOSIS — N200 Calculus of kidney: Secondary | ICD-10-CM

## 2021-03-19 DIAGNOSIS — F32A Depression: Secondary | ICD-10-CM

## 2021-03-19 DIAGNOSIS — I8500 Esophageal varices without bleeding: Secondary | ICD-10-CM

## 2021-03-19 DIAGNOSIS — K769 Liver disease, unspecified: Secondary | ICD-10-CM

## 2021-03-19 DIAGNOSIS — G8929 Other chronic pain: Secondary | ICD-10-CM

## 2021-03-19 DIAGNOSIS — K279 Peptic ulcer, site unspecified, unspecified as acute or chronic, without hemorrhage or perforation: Secondary | ICD-10-CM

## 2021-03-19 DIAGNOSIS — E119 Type 2 diabetes mellitus without complications: Secondary | ICD-10-CM

## 2021-03-19 DIAGNOSIS — J329 Chronic sinusitis, unspecified: Secondary | ICD-10-CM

## 2021-03-19 DIAGNOSIS — U099 COVID-19 long hauler: Secondary | ICD-10-CM

## 2021-03-19 DIAGNOSIS — M1990 Unspecified osteoarthritis, unspecified site: Secondary | ICD-10-CM

## 2021-03-19 DIAGNOSIS — Z8679 Personal history of other diseases of the circulatory system: Secondary | ICD-10-CM

## 2021-03-19 MED ORDER — TRIAMCINOLONE ACETONIDE 40 MG/ML IJ SUSP
80 mg | Freq: Once | EPIDURAL | 0 refills | Status: CP
Start: 2021-03-19 — End: ?

## 2021-03-19 MED ORDER — IOHEXOL 240 MG IODINE/ML IV SOLN
2.5 mL | Freq: Once | EPIDURAL | 0 refills | Status: CP
Start: 2021-03-19 — End: ?

## 2021-03-19 MED ORDER — BUPIVACAINE (PF) 0.5 % (5 MG/ML) IJ SOLN
10 mL | Freq: Once | INTRAMUSCULAR | 0 refills | Status: CP
Start: 2021-03-19 — End: ?

## 2021-03-19 NOTE — Procedures
Attending Surgeon: Lizbeth Bark, MD    Anesthesia: Local    Pre-Procedure Diagnosis:   1. Sacroiliitis (HCC)    2. Greater trochanteric pain syndrome        Post-Procedure Diagnosis:   1. Sacroiliitis (HCC)    2. Greater trochanteric pain syndrome        Pain Score: Six    [place] AMB SPINE LARGE JOINT DRAIN/INJECT PROC: bilateral greater trochanteric bursa    Consent:   Consent obtained: verbal and written  Consent given by: patient  Risks discussed: bleeding, damage to surrounding structures, hyperglycemia, infection, pain and skin discoloration  Alternatives discussed: alternative treatment and no treatment  Discussed with patient the purpose of the treatment/procedure, other ways of treating my condition, including no treatment/ procedure and the risks and benefits of the alternatives. Patient has decided to proceed with treatment/procedure.        Universal Protocol:  Relevant documents: relevant documents present and verified  Test results: test results available and properly labeled  Imaging studies: imaging studies available  Required items: required blood products, implants, devices, and special equipment available  Site marked: the operative site was marked  Patient identity confirmed: Patient identify confirmed verbally with patient.        Time out: Immediately prior to procedure a time out was called to verify the correct patient, procedure, equipment, support staff and site/side marked as required      Procedures Details:  Procedure Peformed: Injection Only  Indications: pain  Details:Prep: 2% chlorhexidine   22 G needle, fluoroscopy-guided       lateral approachOutcome: tolerated well, no immediate complications  Comments: INTERVENTIONAL PAIN MANAGEMENT PROCEDURE REPORT      Date of Service: 03/19/2021    Procedure Title(s):    1.Bilateral GTB injections   2. 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 The patient's history and physical exam were reviewed. The risks, benefits and alternatives to the procedure were discussed, and all questions were answered to the patient's satisfaction. The patient agreed to proceed, and written informed consent was obtained.     Procedure in Detail: IV was started? No    The patient was brought into the procedure room and placed in the prone position on the fluoroscopy table. Standard monitors were placed, and vital signs were observed throughout the procedure. The area of the Bilateral hip was prepped with Alcohol and draped in a sterile manner.     The right greater trochanteric bursa was located. The skin and subcutaneous tissues were anesthetized with 1% lidocaine. A 22-gauge, 5-inch spinal needle was advanced until bone was contacted. Negative aspiration was confirmed and 1mL of contrast was injected and the intra-articular space was appropriately outlined.     Then, a solution consisting of 2 mL triamcinolone (80mg /mL) and 2 mL 0.25% bupivacaine was injected. The needle was removed with a lidocaine flush.    The same procedure was then performed on the opposite side: Yes.    The patient's back was cleaned and a bandage was placed over the site of needle insertion.    Disposition: The patient tolerated the procedure well, and there were no apparent complications. Vital signs remained stable througtout  the procedure. The patient was taken to the recovery area where discharge instructions for the procedure were given.     Estimated Blood Loss: minimal    Specimens: none    Complications: None      [place] AMB SPINE SI JOINT INJECT ANESTH/STEROID PROC  Location: sacroiliac joint     Consent:   Consent obtained: written and verbal  Consent given by: patient  Risks discussed: damage to surrounding structures, infection and pain  Alternatives discussed: alternative treatment Universal Protocol:  Relevant documents: relevant documents present and verified  Test results: test results available and properly labeled  Imaging studies: imaging studies available  Required items: required blood products, implants, devices, and special equipment available  Site marked: the operative site was marked  Patient identity confirmed: Patient identify confirmed verbally with patient.        Time out: Immediately prior to procedure a time out was called to verify the correct patient, procedure, equipment, support staff and site/side marked as required      Procedures Details:   Indications: pain   Prep: 2% chlorhexidine  Guidance: fluoroscopy  Needle size: 22 G  Approach: posterior  Patient tolerance: Patient tolerated the procedure well with no immediate complications. Pressure was applied, and hemostasis was accomplished.  Comments: The procedure risks and benefits were explained.  Informed consent was obtained from the patient.  The patient was placed in prone position on the fluoroscopy table.  Blood pressure cuff and oxygen saturation monitors were attached and the patient was monitored throughout the procedure.  The left SI joint was identified with the use of fluoroscopy in the AP view.  The skin was prepped using Chlorhexadine and draped in aseptic fashion.  C-arm was rotated obliquely towards the right side to line up the anterior and posterior margins of the SI joint.  Skin and subcutaneous tissue were anesthetized using 2.5 mL of 1 percent lidocaine with 25-gauge needle.  A 3-1/2-inch 22-gauge spinal needle was advanced parallel to the x-ray beam towards the lower third of the joint line.  The needle was advanced slowly until the tip of the needle made contact with the bone.  The needle was walked off from the bone to the joint space.  We subsequently injected 0.4 mL of Isovue contrast dye.  An arthrogram was seen.  After negative aspiration, a solution containing 1 mL of 1 percent lidocaine and 40 mg of triamcinolone was injected.  The stylet was re-inserted and needle was then removed.  There was no sensory or motor deficit in the extremity noted.     Attention was then taken to the right SI joint, which was identified with the use of fluoroscopy in the AP view.  The skin was prepped using Chlorhexadine and draped in aseptic fashion.  The C-arm was rotated obliquely towards the left side to line up the anterior and posterior margins at the SI joint.  The skin and subcutaneous tissue was anesthetized using 2.5 mL of 1 percent lidocaine with 25-gauge needle.  A 3-1/2-inch, 22-gauge spinal needle was advanced parallel to the x-ray beam towards the lower third of the joint line.  The needle was advanced slowly until the tip of the needle made contact with the bone.  The needle was walked off from the bone to the joint space.  We injected 0.4 mL of Isovue contrast dye and an arthrogram was seen.  After negative aspiration, a solution containing 1 mL of 1 percent lidocaine and 40 mg of triamcinolone was  injected.  The stylet was re-inserted and the needle was then removed.  There was no sensory or motor deficit in the extremity noted.      After the procedure, the patient's blood pressure, heart rate, oxygen saturation, and VAS were recorded in the chart.  There were no complications.  The patient tolerated the procedure well and was brought to the recovery room for observation in stable condition and discharged with written discharge instructions.     PLAN OF CARE:  The patient is to follow up in the interventional spine clinic in 6-8 weeks.     The patient was advised to contact the interventional spine center for any of the following:    1. Fever, chills, or night sweats.  2. New onset severe sharp pain.  3. Any new upper or lower extremity weakness or numbness.  4. Any questions regarding the procedure.     If unable to contact the interventional spine center, the patient was instructed to go to the local emergency room.             Estimated blood loss: none or minimal  Specimens: none  Patient tolerated the procedure well with no immediate complications. Pressure was applied, and hemostasis was accomplished.

## 2021-03-19 NOTE — Progress Notes
SPINE CENTER  INTERVENTIONAL PAIN PROCEDURE HISTORY AND PHYSICAL    No chief complaint on file.      HISTORY OF PRESENT ILLNESS:  Erin Sanchez is a 60 y.o. year old female who presents for injection.  Denies fevers, chills, or recent hospitalizations.  Patient denies currently taking blood thinning medications.       Medical History:   Diagnosis Date   ? Anxiety disorder    ? Arthritis    ? Chronic pain    ? Chronic sinusitis    ? Cirrhosis (HCC)    ? 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 DDD (degenerative disc disease), lumbar    ? Depression    ? Diabetes (HCC)    ? Esophageal varices (HCC)    ? Esophagitis    ? GERD (gastroesophageal reflux disease)    ? Hepatitis C     per labs on 02/15/2020 Hep C negative.   treated   ? Hepatitis C 2011   ? History of hypertension    ? Hypertension    ? Iron deficiency anemia    ? Kidney stone    ? Left eye injury     decreased blood flow due to Covid   ? Liver disease     HEP C -SPLEEN   ? Lumbar radiculopathy    ? Nausea     chronic   ? 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 Peptic ulcer    ? Somatic dysfunction of thoracic region        Surgical History:   Procedure Laterality Date   ? HERNIA REPAIR  1999   ? HX ENDOSCOPY  2012   ? COLONOSCOPY  2012   ? Colonoscopy N/A 04/02/2020    Performed by Samuel Jester, MD at [facility] OR   ? ESOPHAGOGASTRODUODENOSCOPY WITH BIOPSY - FLEXIBLE N/A 04/02/2020    Performed by Samuel Jester, MD at [facility] [place]   ? LAPAROSCOPIC REPAIR PARAESOPHAGEAL HERNIA WITH FUNDOPLASTY AND IMPLANTATION OF MESH N/A 01/22/2021    Performed by Bufford Lope, MD at [facility] [place]   ? HX SHOULDER REPLACEMENT      right   ? KNEE REPLACEMENT      left       family history includes Cancer in her mother; Heart Attack in her mother; Other in her father; Stroke in her father.    Social History     Socioeconomic History   ? Marital status: Widowed   Occupational History     Employer: [employer]   Tobacco Use   ? Smoking status: Former Smoker     Packs/day: 0.25     Years: 15.00     Pack years: 3.75     Types: Cigarettes     Quit date: 03/09/1998     Years since quitting: 23.0   ? Smokeless tobacco: Never Used   Vaping Use   ? Vaping Use: Never used   Substance and Sexual Activity   ? Alcohol use: Not Currently   ? Drug use: No   ? Sexual activity: Not Currently     Birth control/protection: None       Allergies   Allergen Reactions   ? Codeine ITCHING   ? Ciprofloxacin UNKNOWN     Used for an eye infection, caused patient to be very ill.       Vitals:    03/19/21 1540   BP: 128/84   BP Source: Arm, Left Upper  Pulse: 84   Temp: 36.9 ?C (98.5 ?F)   Resp: 18   SpO2: 97%   TempSrc: Oral   PainSc: Six   Weight: 88.5 kg (195 lb)   Height: 170.2 cm (5' 7)     Pain Score: Six       REVIEW OF SYSTEMS: 10 point ROS obtained and negative except back pain hip pain      PHYSICAL EXAM:  General: 61 y.o. female appears stated age, in no acute distress  HEENT: Normocephalic, atraumatic  Neck: No thyroidmegaly  Cardiovascular: Well perfused  Pulmonary: Unlabored respirations  Extremities: No cyanosis, clubbing, or edema  Skin: No lesions seen on exposed skin  Psychiatric:  Appropriate mood and affect  Musculoskeletal: No atrophy.   Neurologic: Antigravity strength in all extremities. CN II -XII grossly intact.  Alert and oriented x 3.           IMPRESSION:    1. Sacroiliitis (HCC)    2. Greater trochanteric pain syndrome         PLAN: Sacroiliac Joint Injection bilateral and bilateral greater trochanteric bursa injection

## 2021-03-19 NOTE — Patient Instructions
Procedure Completed Today: Other SI and bilateral hips    Important information following your procedure today: You may drive today    Pain relief may not be immediate. It is possible you may even experience an increase in pain during the first 24-48 hours followed by a gradual decrease of your pain.  Though the procedure is generally safe and complications are rare, we do ask that you be aware of any of the following:   Any swelling, persistent redness, new bleeding, or drainage from the site of the injection.  You should not experience a severe headache.  You should not run a fever over 101? F.  New onset of sharp, severe back & or neck pain.  New onset of upper or lower extremity numbness or weakness.  New difficulty controlling bowel or bladder function after the injection.  New shortness of breath.    If any of these occur, please call to report this occurrence to a nurse at [phone]. If you are calling after 4:00 p.m., on weekends or holidays please call [phone] and ask to have the resident physician on call for the physician paged or go to your local emergency room.  You may experience soreness at the injection site. Ice can be applied at 20 minute intervals. Avoid application of direct heat, hot showers or hot tubs today.  Avoid strenuous activity today. You may resume your regular activities and exercise tomorrow.  Patients with diabetes may see an elevation in blood sugars for 7-10 days after the injection. It is important to pay close attention to your diet, check your blood sugars daily and report extreme elevations to the physician that treats your diabetes.  Patients taking a daily blood thinner can resume their regular dose this evening.  It is important that you take all medications ordered by your pain physician. Taking medication as ordered is an important part of your pain care plan. If you cannot continue the medication plan, please notify the physician.     Possible side effects to steroids that may occur:  Flushing or redness of the face  Irritability  Fluid retention  Change in women?s menses    The following medications were used: Lidocaine , Bupivicaine  , Triamcinolone  , and Contrast Dye

## 2021-03-19 NOTE — Progress Notes

## 2021-03-31 ENCOUNTER — Encounter: Admit: 2021-03-31 | Discharge: 2021-03-31 | Payer: MEDICAID

## 2021-03-31 MED ORDER — NABUMETONE 750 MG PO TAB
ORAL_TABLET | Freq: Two times a day (BID) | 0 refills
Start: 2021-03-31 — End: ?

## 2021-04-03 ENCOUNTER — Encounter: Admit: 2021-04-03 | Discharge: 2021-04-03 | Payer: MEDICAID

## 2021-04-07 ENCOUNTER — Encounter: Admit: 2021-04-07 | Discharge: 2021-04-07 | Payer: MEDICAID

## 2021-06-04 ENCOUNTER — Encounter: Admit: 2021-06-04 | Discharge: 2021-06-04 | Payer: MEDICAID

## 2021-06-04 MED ORDER — NABUMETONE 750 MG PO TAB
ORAL_TABLET | Freq: Two times a day (BID) | 0 refills
Start: 2021-06-04 — End: ?

## 2021-06-17 ENCOUNTER — Encounter: Admit: 2021-06-17 | Discharge: 2021-06-17 | Payer: MEDICAID

## 2021-06-18 ENCOUNTER — Encounter: Admit: 2021-06-18 | Discharge: 2021-06-18 | Payer: MEDICAID

## 2021-06-18 NOTE — Telephone Encounter
Pt had more than 50% relief form S/I imnj and hip injections done in September this year. She had been more active and able to participate in HEP. She had an interim illness that left her unable to continue HEP. Her pain became exacerbated and she would like to repeat the injections as recc by Dr Noralyn Pick. His recommendation is to offer a series of 3 injections over the next 6-9 months and get the patient back to a functional baseline.

## 2021-07-01 ENCOUNTER — Encounter: Admit: 2021-07-01 | Discharge: 2021-07-01 | Payer: MEDICAID

## 2021-07-02 ENCOUNTER — Encounter: Admit: 2021-07-02 | Discharge: 2021-07-02 | Payer: MEDICAID

## 2021-07-02 ENCOUNTER — Ambulatory Visit: Admit: 2021-07-02 | Discharge: 2021-07-02 | Payer: MEDICAID

## 2021-07-02 DIAGNOSIS — M461 Sacroiliitis, not elsewhere classified: Secondary | ICD-10-CM

## 2021-07-02 DIAGNOSIS — M25559 Pain in unspecified hip: Secondary | ICD-10-CM

## 2021-07-02 MED ORDER — TRIAMCINOLONE ACETONIDE 40 MG/ML IJ SUSP
80 mg | Freq: Once | EPIDURAL | 0 refills | Status: CP
Start: 2021-07-02 — End: ?

## 2021-07-02 MED ORDER — BUPIVACAINE (PF) 0.5 % (5 MG/ML) IJ SOLN
10 mL | Freq: Once | INTRAMUSCULAR | 0 refills | Status: CP
Start: 2021-07-02 — End: ?

## 2021-07-02 MED ORDER — IOHEXOL 240 MG IODINE/ML IV SOLN
1 mL | Freq: Once | EPIDURAL | 0 refills | Status: CP
Start: 2021-07-02 — End: ?

## 2021-07-02 NOTE — Progress Notes
SPINE CENTER  INTERVENTIONAL PAIN PROCEDURE HISTORY AND PHYSICAL    Chief Complaint   Patient presents with   ? Lower Back - Pain       HISTORY OF PRESENT ILLNESS:  Erin Sanchez is a 61 y.o. year old female who presents for injection.  Denies fevers, chills, or recent hospitalizations.  Patient denies currently taking blood thinning medications.       Medical History:   Diagnosis Date   ? Anxiety disorder    ? Arthritis    ? Chronic pain    ? Chronic sinusitis    ? Cirrhosis (HCC)    ? 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 DDD (degenerative disc disease), lumbar    ? Depression    ? Diabetes (HCC)    ? Esophageal varices (HCC)    ? Esophagitis    ? GERD (gastroesophageal reflux disease)    ? Hepatitis C     per labs on 02/15/2020 Hep C negative.   treated   ? Hepatitis C 2011   ? History of hypertension    ? Hypertension    ? Iron deficiency anemia    ? Kidney stone    ? Left eye injury     decreased blood flow due to Covid   ? Liver disease     HEP C -SPLEEN   ? Lumbar radiculopathy    ? Nausea     chronic   ? 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 Peptic ulcer    ? Somatic dysfunction of thoracic region        Surgical History:   Procedure Laterality Date   ? HERNIA REPAIR  1999   ? HX ENDOSCOPY  2012   ? COLONOSCOPY  2012   ? Colonoscopy N/A 04/02/2020    Performed by Samuel Jester, MD at [facility] OR   ? ESOPHAGOGASTRODUODENOSCOPY WITH BIOPSY - FLEXIBLE N/A 04/02/2020    Performed by Samuel Jester, MD at [facility] OR   ? LAPAROSCOPIC REPAIR PARAESOPHAGEAL HERNIA WITH FUNDOPLASTY AND IMPLANTATION OF MESH N/A 01/22/2021    Performed by Bufford Lope, MD at [facility] OR   ? HX SHOULDER REPLACEMENT      right   ? KNEE REPLACEMENT      left       family history includes Cancer in her mother; Heart Attack in her mother; Other in her father; Stroke in her father.    Social History     Socioeconomic History   ? Marital status: Widowed   Occupational History     Employer: [employer]   Tobacco Use   ? Smoking status: Former     Packs/day: 0.25     Years: 15.00     Pack years: 3.75     Types: Cigarettes     Quit date: 03/09/1998     Years since quitting: 23.3   ? Smokeless tobacco: Never   Vaping Use   ? Vaping Use: Never used   Substance and Sexual Activity   ? Alcohol use: Not Currently   ? Drug use: No   ? Sexual activity: Not Currently     Birth control/protection: None       Allergies   Allergen Reactions   ? Codeine ITCHING   ? Ciprofloxacin UNKNOWN     Used for an eye infection, caused patient to be very ill.       Vitals:    07/02/21 1611   BP: (!) 149/88  BP Source: Arm, Right Upper   Pulse: 91   Temp: 37.1 ?C (98.7 ?F)   Resp: 12   SpO2: 99%   TempSrc: Oral   PainSc: Eight   Weight: 88.5 kg (195 lb)   Height: 170.2 cm (5' 7)     Pain Score: Eight  Oswestry Total Score:: 46    REVIEW OF SYSTEMS: 10 point ROS obtained and negative except back and hip pain      PHYSICAL EXAM:  General: 61 y.o. female appears stated age, in no acute distress  HEENT: Normocephalic, atraumatic  Neck: No thyroidmegaly  Cardiovascular: Well perfused  Pulmonary: Unlabored respirations  Extremities: No cyanosis, clubbing, or edema  Skin: No lesions seen on exposed skin  Psychiatric:  Appropriate mood and affect  Musculoskeletal: No atrophy.   Neurologic: Antigravity strength in all extremities. CN II -XII grossly intact.  Alert and oriented x 3.           IMPRESSION:    1. Sacroiliitis (HCC)    2. Greater trochanteric pain syndrome         PLAN: Sacroiliac Joint Injection bilateral, bilateral greater trochanteric bursa injections

## 2021-07-02 NOTE — Progress Notes

## 2021-07-02 NOTE — Procedures
Attending Surgeon: Lizbeth Bark, MD    Anesthesia: Local    Pre-Procedure Diagnosis:   1. Sacroiliitis (HCC)    2. Greater trochanteric pain syndrome        Post-Procedure Diagnosis:   1. Sacroiliitis (HCC)    2. Greater trochanteric pain syndrome        Pain Score: Eight    [place] AMB SPINE SI JOINT INJECT ANESTH/STEROID PROC  Location: sacroiliac joint     Consent:   Consent obtained: written and verbal  Consent given by: patient  Risks discussed: damage to surrounding structures, infection and pain  Alternatives discussed: alternative treatment     Universal Protocol:  Relevant documents: relevant documents present and verified  Test results: test results available and properly labeled  Imaging studies: imaging studies available  Required items: required blood products, implants, devices, and special equipment available  Site marked: the operative site was marked  Patient identity confirmed: Patient identify confirmed verbally with patient.        Time out: Immediately prior to procedure a time out was called to verify the correct patient, procedure, equipment, support staff and site/side marked as required      Procedures Details:   Indications: pain   Prep: 2% chlorhexidine  Guidance: fluoroscopy  Needle size: 22 G  Approach: posterior  Patient tolerance: Patient tolerated the procedure well with no immediate complications. Pressure was applied, and hemostasis was accomplished.  Comments: DESCRIPTION OF PROCEDURE:  The procedure risks and benefits were explained.  Informed consent was obtained from the patient.  The patient was placed in prone position on the fluoroscopy table.  Blood pressure cuff and oxygen saturation monitors were attached and the patient was monitored throughout the procedure.  The left SI joint was identified with the use of fluoroscopy in the AP view.  The skin was prepped using Chlorhexadine and draped in aseptic fashion.  C-arm was rotated obliquely towards the right side to line up the anterior and posterior margins of the SI joint.  Skin and subcutaneous tissue were anesthetized using 2.5 mL of 1 percent lidocaine with 25-gauge needle.  A 3-1/2-inch 22-gauge spinal needle was advanced parallel to the x-ray beam towards the lower third of the joint line.  The needle was advanced slowly until the tip of the needle made contact with the bone.  The needle was walked off from the bone to the joint space.  We subsequently injected 0.4 mL of Isovue contrast dye.  An arthrogram was seen.  After negative aspiration, a solution containing 1 mL of 1 percent lidocaine and 40 mg of triamcinolone was injected.  The stylet was re-inserted and needle was then removed.  There was no sensory or motor deficit in the extremity noted.     Attention was then taken to the right SI joint, which was identified with the use of fluoroscopy in the AP view.  The skin was prepped using Chlorhexadine and draped in aseptic fashion.  The C-arm was rotated obliquely towards the left side to line up the anterior and posterior margins at the SI joint.  The skin and subcutaneous tissue was anesthetized using 2.5 mL of 1 percent lidocaine with 25-gauge needle.  A 3-1/2-inch, 22-gauge spinal needle was advanced parallel to the x-ray beam towards the lower third of the joint line.  The needle was advanced slowly until the tip of the needle made contact with the bone.  The needle was walked off from the bone to the joint space.  We  injected 0.4 mL of Isovue contrast dye and an arthrogram was seen.  After negative aspiration, a solution containing 1 mL of 1 percent lidocaine and 40 mg of triamcinolone was injected.  The stylet was re-inserted and the needle was then removed.  There was no sensory or motor deficit in the extremity noted.      After the procedure, the patient's blood pressure, heart rate, oxygen saturation, and VAS were recorded in the chart.  There were no complications.  The patient tolerated the procedure well and was brought to the recovery room for observation in stable condition and discharged with written discharge instructions.     PLAN OF CARE:  The patient is to follow up in the interventional spine clinic in 4-6 weeks.     The patient was advised to contact the interventional spine center for any of the following:    1. Fever, chills, or night sweats.  2. New onset severe sharp pain.  3. Any new upper or lower extremity weakness or numbness.  4. Any questions regarding the procedure.     If unable to contact the interventional spine center, the patient was instructed to go to the local emergency room.     [place] AMB SPINE LARGE JOINT DRAIN/INJECT PROC: bilateral greater trochanteric bursa    Consent:   Consent obtained: verbal and written  Consent given by: patient  Risks discussed: bleeding, damage to surrounding structures, hyperglycemia, infection, pain and skin discoloration  Alternatives discussed: alternative treatment and no treatment  Discussed with patient the purpose of the treatment/procedure, other ways of treating my condition, including no treatment/ procedure and the risks and benefits of the alternatives. Patient has decided to proceed with treatment/procedure.        Universal Protocol:  Relevant documents: relevant documents present and verified  Test results: test results available and properly labeled  Imaging studies: imaging studies available  Required items: required blood products, implants, devices, and special equipment available  Site marked: the operative site was marked  Patient identity confirmed: Patient identify confirmed verbally with patient.        Time out: Immediately prior to procedure a time out was called to verify the correct patient, procedure, equipment, support staff and site/side marked as required      Procedures Details:  Procedure Peformed: Injection Only  Indications: pain  Details:Prep: 2% chlorhexidine   22 G needle, fluoroscopy-guided       lateral approachOutcome: tolerated well, no immediate complications  Comments: DESCRIPTION OF PROCEDURE:  The procedure risks and benefits were explained and informed consent was obtained from the patient.  The patient was placed in the right lateral decubitus position, and the left greater trochanter was palpated and marked. The skin was prepped with betadine times 3 and draped in aseptic fashion. The C-arm was rotated to the AP view. Skin and subcutaneous tissue was anesthetized using 3 mL of 1 percent lidocaine with a 25-gauge needle. A 3.5 inch 25 gauge spinal needle was advanced perpendicular to the xray beam towards the left greater trochanter. After contact with bone, a total of 0.2 mL of isovue contrast dye was injected. Bursal spread was seen, and after negative aspiration a solution containing 40 mg triamcinolone and 2 mL of 0.5 percent bupivacaine was injected. The stylet was reinserted and the needle was then removed.   Attention was then taken to the right greater trochanter.  The patient was placed in the left lateral decubitus position, and the right greater trochanter was palpated  and marked. The skin was prepped with betadine times 3 and draped in aseptic fashion. The C-arm was rotated to the AP view. Skin and subcutaneous tissue was anesthetized using 3 mL of 1 percent lidocaine with a 25-gauge needle. A 3.5 inch 25 gauge spinal needle was advanced perpendicular to the xray beam towards the right greater trochanter. After contact with bone, a total of 0.2 mL of isovue contrast dye was injected. Bursal spread was seen, and after negative aspiration a solution containing 40 mg triamcinolone and 2 mL of 0.5 percent bupivacaine was injected. The stylet was reinserted and the needle was then removed.     The patient tolerated the procedure well was brought to the recovery room for observation in stable condition and discharged with written discharge instructions.      Plan of care: The patient is to follow up in the Spine Clinic in 2-3 weeks.     The patient advised to contact Interventional Spine Center for any of the following:   Fever, chills, or night sweats.  New onset severe sharp pain.  Any new upper or lower extremity weakness or numbness.  Any questions regarding the procedure.     If unable to contact Interventional Spine Center, the patient instructed a local emergency room.         Administrations This Visit     bupivacaine PF (MARCAINE) 0.5 % injection 10 mL     Admin Date  07/02/2021 Action  Given Dose  10 mL Route  Injection Administered By  Lizbeth Bark, MD          iohexoL (OMNIPAQUE-240) 240 mg/mL injection 1 mL     Admin Date  07/02/2021 Action  Given Dose  1 mL Route  Epidural Administered By  Lizbeth Bark, MD          triamcinolone acetonide [facility]) injection 80 mg     Admin Date  07/02/2021 Action  Given Dose  80 mg Route  Epidural Administered By  Lizbeth Bark, MD              Estimated blood loss: none or minimal  Specimens: none  Patient tolerated the procedure well with no immediate complications. Pressure was applied, and hemostasis was accomplished.

## 2021-07-03 ENCOUNTER — Encounter: Admit: 2021-07-03 | Discharge: 2021-07-03 | Payer: MEDICAID

## 2021-07-03 DIAGNOSIS — M25559 Pain in unspecified hip: Secondary | ICD-10-CM

## 2021-10-14 ENCOUNTER — Encounter: Admit: 2021-10-14 | Discharge: 2021-10-14 | Payer: MEDICAID

## 2021-10-15 ENCOUNTER — Encounter: Admit: 2021-10-15 | Discharge: 2021-10-15 | Payer: MEDICAID

## 2021-10-15 ENCOUNTER — Ambulatory Visit: Admit: 2021-10-15 | Discharge: 2021-10-15 | Payer: MEDICAID

## 2021-10-15 DIAGNOSIS — M25559 Pain in unspecified hip: Secondary | ICD-10-CM

## 2021-10-15 DIAGNOSIS — M461 Sacroiliitis, not elsewhere classified: Secondary | ICD-10-CM

## 2021-10-15 MED ORDER — TRIAMCINOLONE ACETONIDE 40 MG/ML IJ SUSP
80 mg | Freq: Once | EPIDURAL | 0 refills | Status: CP
Start: 2021-10-15 — End: ?
  Administered 2021-10-15: 22:00:00 80 mg via EPIDURAL

## 2021-10-15 MED ORDER — IOHEXOL 240 MG IODINE/ML IV SOLN
1 mL | Freq: Once | EPIDURAL | 0 refills | Status: CP
Start: 2021-10-15 — End: ?
  Administered 2021-10-15: 22:00:00 1 mL via EPIDURAL

## 2021-10-15 MED ORDER — BUPIVACAINE (PF) 0.5 % (5 MG/ML) IJ SOLN
8 mL | Freq: Once | INTRAMUSCULAR | 0 refills | Status: CP
Start: 2021-10-15 — End: ?
  Administered 2021-10-15: 22:00:00 8 mL via INTRAMUSCULAR

## 2021-10-15 NOTE — Progress Notes

## 2021-10-15 NOTE — Patient Instructions
Procedure Completed Today: Joint Injection  SI injection    Important information following your procedure today: You may drive today    Pain relief may not be immediate. It is possible you may even experience an increase in pain during the first 24-48 hours followed by a gradual decrease of your pain.  Though the procedure is generally safe and complications are rare, we Loran Fleet ask that you be aware of any of the following:   Any swelling, persistent redness, new bleeding, or drainage from the site of the injection.  You should not experience a severe headache.  You should not run a fever over 101? F.  New onset of sharp, severe back & or neck pain.  New onset of upper or lower extremity numbness or weakness.  New difficulty controlling bowel or bladder function after the injection.  New shortness of breath.    If any of these occur, please call to report this occurrence to a nurse at [phone]. If you are calling after 4:00 p.m., on weekends or holidays please call [phone] and ask to have the resident physician on call for the physician paged or go to your local emergency room.  You may experience soreness at the injection site. Ice can be applied at 20 minute intervals. Avoid application of direct heat, hot showers or hot tubs today.  Avoid strenuous activity today. You may resume your regular activities and exercise tomorrow.  Patients with diabetes may see an elevation in blood sugars for 7-10 days after the injection. It is important to pay close attention to your diet, check your blood sugars daily and report extreme elevations to the physician that treats your diabetes.  Patients taking a daily blood thinner can resume their regular dose this evening.  It is important that you take all medications ordered by your pain physician. Taking medication as ordered is an important part of your pain care plan. If you cannot continue the medication plan, please notify the physician.     Possible side effects to steroids that may occur:  Flushing or redness of the face  Irritability  Fluid retention  Change in women?s menses    The following medications were used: Lidocaine , Bupivicaine  , Triamcinolone  , and Contrast Dye

## 2021-11-11 ENCOUNTER — Encounter: Admit: 2021-11-11 | Discharge: 2021-11-11 | Payer: MEDICAID

## 2021-11-11 DIAGNOSIS — M25559 Pain in unspecified hip: Secondary | ICD-10-CM

## 2021-11-11 DIAGNOSIS — M461 Sacroiliitis, not elsewhere classified: Secondary | ICD-10-CM

## 2021-11-11 NOTE — Telephone Encounter
Reports more than 80% relief form injections done in April. She would like to schedule repeat injections as recc by Dr Noralyn Pick.

## 2021-11-17 ENCOUNTER — Encounter: Admit: 2021-11-17 | Discharge: 2021-11-17 | Payer: MEDICAID

## 2021-11-17 DIAGNOSIS — D509 Iron deficiency anemia, unspecified: Secondary | ICD-10-CM

## 2021-11-17 DIAGNOSIS — K279 Peptic ulcer, site unspecified, unspecified as acute or chronic, without hemorrhage or perforation: Secondary | ICD-10-CM

## 2021-11-17 MED ORDER — OMEPRAZOLE 40 MG PO CPDR
ORAL_CAPSULE | 1 refills
Start: 2021-11-17 — End: ?

## 2021-11-18 ENCOUNTER — Encounter: Admit: 2021-11-18 | Discharge: 2021-11-18 | Payer: MEDICAID

## 2021-11-20 ENCOUNTER — Encounter: Admit: 2021-11-20 | Discharge: 2021-11-20 | Payer: MEDICAID

## 2021-11-20 NOTE — Telephone Encounter
PA needed for Omeprazole BID dosing.     PA submitted via CMM.     Key: [id number]

## 2022-01-12 ENCOUNTER — Encounter: Admit: 2022-01-12 | Discharge: 2022-01-12 | Payer: MEDICAID

## 2022-01-12 NOTE — Progress Notes
SPINE [facility]ENTER  INTERVENTIONAL PAIN PROCEDURE HISTORY AND PHYSICAL    Chief Complaint: Pain    HISTORY OF PRESENT ILLNESS:    Patient returns today for interventional treatment of pain. Patient denies any recent fevers, chills, infection, antibiotics, coagulopathy or contra-indicated anticoagulants. Risks of the procedure were discussed including but not limited to bleeding, infection, damage to surrounding structures and reaction to medications. Patient reports understanding and has elected to proceed with the procedure. This patient had at least 50% pain relief for at least 3 months with the last injections.    Medical History:   Diagnosis Date   ? Anxiety disorder    ? Arthritis    ? Chronic pain    ? Chronic sinusitis    ? Cirrhosis (HCC)    ? 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 DDD (degenerative disc disease), lumbar    ? Depression    ? Diabetes (HCC)    ? Esophageal varices (HCC)    ? Esophagitis    ? GERD (gastroesophageal reflux disease)    ? Hepatitis C     per labs on 02/15/2020 Hep C negative.   treated   ? Hepatitis C 2011   ? History of hypertension    ? Hypertension    ? Iron deficiency anemia    ? Kidney stone    ? Left eye injury     decreased blood flow due to Covid   ? Liver disease     HEP C -SPLEEN   ? Lumbar radiculopathy    ? Nausea     chronic   ? 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 Peptic ulcer    ? Somatic dysfunction of thoracic region        Surgical History:   Procedure Laterality Date   ? HERNIA REPAIR  1999   ? HX ENDOSCOPY  2012   ? COLONOSCOPY  2012   ? Colonoscopy N/A 04/02/2020    Performed by Samuel Jester, MD at [facility] OR   ? ESOPHAGOGASTRODUODENOSCOPY WITH BIOPSY - FLEXIBLE N/A 04/02/2020    Performed by Samuel Jester, MD at [facility] OR   ? LAPAROSCOPIC REPAIR PARAESOPHAGEAL HERNIA WITH FUNDOPLASTY AND IMPLANTATION OF MESH N/A 01/22/2021    Performed by Bufford Lope, MD at [facility] OR   ? HX SHOULDER REPLACEMENT      right   ? KNEE REPLACEMENT      left       family history includes Cancer in her mother; Heart Attack in her mother; Other in her father; Stroke in her father.    Social History     Socioeconomic History   ? Marital status: Widowed   Occupational History     Employer: [employer]   Tobacco Use   ? Smoking status: Former     Packs/day: 0.25     Years: 15.00     Pack years: 3.75     Types: Cigarettes     Quit date: 03/09/1998     Years since quitting: 23.8   ? Smokeless tobacco: Never   Vaping Use   ? Vaping Use: Never used   Substance and Sexual Activity   ? Alcohol use: Not Currently   ? Drug use: No   ? Sexual activity: Not Currently     Birth control/protection: None       Allergies   Allergen Reactions   ? Codeine ITCHING   ? Ciprofloxacin UNKNOWN     Used for an  eye infection, caused patient to be very ill.           REVIEW OF SYSTEMS: 10 point ROS obtained and negative except as above.      PHYSICAL EXAM:  General: Alert, cooperative, no acute distress.  HEENT: Normocephalic, atraumatic.  Neck: Supple.  Lungs: Unlabored respirations, bilateral and equal chest excursion.  Heart: Regular rate.  Skin: Warm and dry to touch.  Abdomen: Nondistended.  MSK: No deformity  Neurological: Alert and oriented x3.        IMPRESSION:    1. Greater trochanteric pain syndrome    2. Sacroiliitis (HCC)         PLAN:   Bilateral SI Joint Injections  Bilateral Greater Trochanteric Bursa Injections    Patient was seen, examined, and discussed with attending physician, Dr. Noralyn Pick, who agrees with assessment and plan.    Aurelio Brash MD  [facility] Medicine Fellow  PM&R

## 2022-01-14 ENCOUNTER — Encounter: Admit: 2022-01-14 | Discharge: 2022-01-14 | Payer: MEDICAID

## 2022-01-14 ENCOUNTER — Ambulatory Visit: Admit: 2022-01-14 | Discharge: 2022-01-14 | Payer: MEDICAID

## 2022-01-14 DIAGNOSIS — K219 Gastro-esophageal reflux disease without esophagitis: Secondary | ICD-10-CM

## 2022-01-14 DIAGNOSIS — Z8679 Personal history of other diseases of the circulatory system: Secondary | ICD-10-CM

## 2022-01-14 DIAGNOSIS — E119 Type 2 diabetes mellitus without complications: Secondary | ICD-10-CM

## 2022-01-14 DIAGNOSIS — K279 Peptic ulcer, site unspecified, unspecified as acute or chronic, without hemorrhage or perforation: Secondary | ICD-10-CM

## 2022-01-14 DIAGNOSIS — K7460 Unspecified cirrhosis of liver: Secondary | ICD-10-CM

## 2022-01-14 DIAGNOSIS — M461 Sacroiliitis, not elsewhere classified: Secondary | ICD-10-CM

## 2022-01-14 DIAGNOSIS — M1990 Unspecified osteoarthritis, unspecified site: Secondary | ICD-10-CM

## 2022-01-14 DIAGNOSIS — I10 Essential (primary) hypertension: Secondary | ICD-10-CM

## 2022-01-14 DIAGNOSIS — J329 Chronic sinusitis, unspecified: Secondary | ICD-10-CM

## 2022-01-14 DIAGNOSIS — B1920 Unspecified viral hepatitis C without hepatic coma: Secondary | ICD-10-CM

## 2022-01-14 DIAGNOSIS — G8929 Other chronic pain: Secondary | ICD-10-CM

## 2022-01-14 DIAGNOSIS — K209 Esophagitis: Secondary | ICD-10-CM

## 2022-01-14 DIAGNOSIS — M5136 Other intervertebral disc degeneration, lumbar region: Secondary | ICD-10-CM

## 2022-01-14 DIAGNOSIS — F32A Depression: Secondary | ICD-10-CM

## 2022-01-14 DIAGNOSIS — R110 Nausea: Secondary | ICD-10-CM

## 2022-01-14 DIAGNOSIS — U099 COVID-19 long hauler: Secondary | ICD-10-CM

## 2022-01-14 DIAGNOSIS — M25559 Pain in unspecified hip: Secondary | ICD-10-CM

## 2022-01-14 DIAGNOSIS — M5416 Radiculopathy, lumbar region: Secondary | ICD-10-CM

## 2022-01-14 DIAGNOSIS — N200 Calculus of kidney: Secondary | ICD-10-CM

## 2022-01-14 DIAGNOSIS — D509 Iron deficiency anemia, unspecified: Secondary | ICD-10-CM

## 2022-01-14 DIAGNOSIS — K449 Diaphragmatic hernia without obstruction or gangrene: Secondary | ICD-10-CM

## 2022-01-14 DIAGNOSIS — F419 Anxiety disorder, unspecified: Secondary | ICD-10-CM

## 2022-01-14 DIAGNOSIS — S0592XA Unspecified injury of left eye and orbit, initial encounter: Secondary | ICD-10-CM

## 2022-01-14 DIAGNOSIS — K769 Liver disease, unspecified: Secondary | ICD-10-CM

## 2022-01-14 DIAGNOSIS — I8500 Esophageal varices without bleeding: Secondary | ICD-10-CM

## 2022-01-14 DIAGNOSIS — M9902 Segmental and somatic dysfunction of thoracic region: Secondary | ICD-10-CM

## 2022-01-14 MED ORDER — TRIAMCINOLONE ACETONIDE 40 MG/ML IJ SUSP
80 mg | Freq: Once | EPIDURAL | 0 refills | Status: CP
Start: 2022-01-14 — End: ?

## 2022-01-14 MED ORDER — BUPIVACAINE (PF) 0.5 % (5 MG/ML) IJ SOLN
8 mL | Freq: Once | INTRAMUSCULAR | 0 refills | Status: CP
Start: 2022-01-14 — End: ?

## 2022-01-14 MED ORDER — IOHEXOL 240 MG IODINE/ML IV SOLN
1 mL | Freq: Once | EPIDURAL | 0 refills | Status: CP
Start: 2022-01-14 — End: ?

## 2022-01-14 NOTE — Progress Notes

## 2022-01-14 NOTE — Procedures
Attending Surgeon: Lizbeth Bark, MD    Anesthesia: Local    Pre-Procedure Diagnosis:   1. Greater trochanteric pain syndrome    2. Sacroiliitis (HCC)        Post-Procedure Diagnosis:   1. Greater trochanteric pain syndrome    2. Sacroiliitis (HCC)        Pain Score: Seven     AMB SPINE LARGE JOINT DRAIN/INJECT PROC: bilateral greater trochanteric bursa    Consent:   Consent obtained: verbal and written  Consent given by: patient  Risks discussed: bleeding, damage to surrounding structures, hyperglycemia, infection, pain and skin discoloration  Alternatives discussed: alternative treatment and no treatment  Discussed with patient the purpose of the treatment/procedure, other ways of treating my condition, including no treatment/ procedure and the risks and benefits of the alternatives. Patient has decided to proceed with treatment/procedure.        Universal Protocol:  Relevant documents: relevant documents present and verified  Test results: test results available and properly labeled  Imaging studies: imaging studies available  Required items: required blood products, implants, devices, and special equipment available  Site marked: the operative site was marked  Patient identity confirmed: Patient identify confirmed verbally with patient.        Time out: Immediately prior to procedure a time out was called to verify the correct patient, procedure, equipment, support staff and site/side marked as required      Procedures Details:  Procedure Peformed: Injection Only  Indications: pain  Details:Prep: 2% chlorhexidine   22 G needle, fluoroscopy-guided       lateral approachOutcome: tolerated well, no immediate complications  Comments: DESCRIPTION OF PROCEDURE:  The procedure risks and benefits were explained and informed consent was obtained from the patient.  The patient was placed in the right lateral decubitus position, and the left greater trochanter was palpated and marked. The skin was prepped with betadine times 3 and draped in aseptic fashion. The C-arm was rotated to the AP view. Skin and subcutaneous tissue was anesthetized using 3 mL of 1 percent lidocaine with a 25-gauge needle. A 3.5 inch 25 gauge spinal needle was advanced perpendicular to the xray beam towards the left greater trochanter. After contact with bone, a total of 0.2 mL of isovue contrast dye was injected. Bursal spread was seen, and after negative aspiration a solution containing 40 mg triamcinolone and 2 mL of 0.5 percent bupivacaine was injected. The stylet was reinserted and the needle was then removed.   Attention was then taken to the right greater trochanter.  The patient was placed in the left lateral decubitus position, and the right greater trochanter was palpated and marked. The skin was prepped with betadine times 3 and draped in aseptic fashion. The C-arm was rotated to the AP view. Skin and subcutaneous tissue was anesthetized using 3 mL of 1 percent lidocaine with a 25-gauge needle. A 3.5 inch 25 gauge spinal needle was advanced perpendicular to the xray beam towards the right greater trochanter. After contact with bone, a total of 0.2 mL of isovue contrast dye was injected. Bursal spread was seen, and after negative aspiration a solution containing 40 mg triamcinolone and 2 mL of 0.5 percent bupivacaine was injected. The stylet was reinserted and the needle was then removed.     The patient tolerated the procedure well was brought to the recovery room for observation in stable condition and discharged with written discharge instructions.      Plan of care: The patient is to  follow up in the Spine Clinic in 2-3 weeks.     The patient advised to contact [facility] for any of the following:   Fever, chills, or night sweats.  New onset severe sharp pain.  Any new upper or lower extremity weakness or numbness.  Any questions regarding the procedure.     If unable to contact [facility], the patient instructed a local emergency room.     [place] AMB SPINE SI JOINT INJECT ANESTH/STEROID PROC  Location: sacroiliac joint Bilat sacroiliac joint    Consent:   Consent obtained: verbal and written  Consent given by: patient  Risks discussed: arrhythmia, skin discoloration, bleeding, soft tissue reaction, bradycardia, vomiting, damage to surrounding structures, headache, hyperglycemia, infection, itching, nerve damage and pain  Alternatives discussed: alternative treatment  Discussed with patient the purpose of the treatment/procedure, other ways of treating my condition, including no treatment/ procedure and the risks and benefits of the alternatives. Patient has decided to proceed with treatment/procedure.        Universal Protocol:  Relevant documents: relevant documents present and verified  Test results: test results available and properly labeled  Imaging studies: imaging studies available  Required items: required blood products, implants, devices, and special equipment available  Site marked: the operative site was marked  Patient identity confirmed: Patient identify confirmed verbally with patient.        Time out: Immediately prior to procedure a time out was called to verify the correct patient, procedure, equipment, support staff and site/side marked as required      Procedures Details:   Indications: pain   Pre Procedure Pain Level - 7  At Least Three Positive findings with provacative maneuvers including - FABER, SI Compression Test, Gaenslen's Test and Yeoman's SI Test  Reason for Procedure - Therapeutic  Patient experiences moderate to severe low back pain primarily over the anatomical location of the SI joints between the upper level of the iliac crests and the gluteal fold below L5 without radiculopathy, duration of at least three (3) months, and pain persists despite a minimum of four weeks of conservative therapies.   Clinical findings and/or imaging studies do not suggest other diagnosed or obvious cause of the lumbosacral pain (such as central spinal stenosis with neurogenic claudication/myelopathy, foraminal stenosis or disc herniation with concordant radicular pain/radiculopathy, infection, tumor, fracture, pseudoarthrosis, or pain related to spinal instrumentation).   Prep: 2% chlorhexidine  Local anesthetic: subcutaneous lidocaine 1% 10mg /mL  Needle size: 22 G  Approach: posterior  Patient tolerance: Patient tolerated the procedure well with no immediate complications. Pressure was applied, and hemostasis was accomplished.  Comments: DESCRIPTION OF PROCEDURE:  The procedure risks and benefits were explained.  Informed consent was obtained from the patient.  The patient was placed in prone position on the fluoroscopy table.  Blood pressure cuff and oxygen saturation monitors were attached and the patient was monitored throughout the procedure.  The right SI joint was identified with the use of fluoroscopy in the AP view.  The skin was prepped using Chlorhexadine and draped in aseptic fashion.  C-arm was rotated obliquely towards the right side to line up the anterior and posterior margins of the SI joint.  Skin and subcutaneous tissue were anesthetized using 3 mL of 1 percent lidocaine with 25-gauge needle.  A 3-1/2-inch 22-gauge spinal needle was advanced parallel to the x-ray beam towards the lower third of the joint line.  The needle was advanced slowly until the tip of the needle made  contact with the bone.  The needle was walked off from the bone to the joint space.  We subsequently injected 0.4 mL of contrast dye.  An arthrogram was seen.  After negative aspiration, a solution containing 1 mL of 0.25 percent bupivacaine and 40 mg of triamcinolone was injected.  The stylet was re-inserted and needle was then removed.  There was no sensory or motor deficit in the extremity noted.     Attention was taken to the left side where the injection was performed in similar manner.    After the procedure, the patient's blood pressure, heart rate, oxygen saturation, and VAS were recorded in the chart.  There were no complications.  The patient tolerated the procedure well and was brought to the recovery room for observation in stable condition and discharged with written discharge instructions.     PLAN OF CARE:  The patient is to follow up in the interventional spine clinic in 3 weeks.     The patient was advised to contact the interventional spine center for any of the following:    Fever, chills, or night sweats.  New onset severe sharp pain.  Any new upper or lower extremity weakness or numbness.  Any questions regarding the procedure.     If unable to contact the interventional spine center, the patient was instructed to go to the local emergency room.               Administrations This Visit     bupivacaine PF (MARCAINE) 0.5 % injection 8 mL     Admin Date  01/14/2022 Action  Given Dose  8 mL Route  Injection Administered By  Gates Rigg, RN          iohexoL (OMNIPAQUE-240) 240 mg/mL injection 1 mL     Admin Date  01/14/2022 Action  Given Dose  1 mL Route  Epidural Administered By  Gates Rigg, RN          triamcinolone acetonide [facility]) injection 80 mg     Admin Date  01/14/2022 Action  Given Dose  80 mg Route  Epidural Administered By  Gates Rigg, RN              Estimated blood loss: none or minimal  Specimens: none  Patient tolerated the procedure well with no immediate complications. Pressure was applied, and hemostasis was accomplished.

## 2022-01-23 ENCOUNTER — Encounter: Admit: 2022-01-23 | Discharge: 2022-01-23 | Payer: MEDICAID

## 2022-02-18 ENCOUNTER — Inpatient Hospital Stay: Admit: 2022-02-18 | Payer: MEDICAID

## 2022-02-18 ENCOUNTER — Encounter: Admit: 2022-02-18 | Discharge: 2022-02-18 | Payer: MEDICAID

## 2022-02-18 DIAGNOSIS — K819 Cholecystitis, unspecified: Secondary | ICD-10-CM

## 2022-02-18 DIAGNOSIS — K8050 Calculus of bile duct without cholangitis or cholecystitis without obstruction: Secondary | ICD-10-CM

## 2022-02-18 LAB — PHOSPHORUS: PHOSPHORUS: 2.6 mg/dL (ref 2.0–4.5)

## 2022-02-18 LAB — LIPID PROFILE
CHOLESTEROL: 181 mg/dL — ABNORMAL LOW (ref <200)
HDL: 47 mg/dL (ref >40)
LDL: 113 mg/dL — ABNORMAL HIGH (ref <100)
NON HDL CHOLESTEROL: 134 mg/dL (ref 3.5–5.0)
TRIGLYCERIDES: 138 mg/dL (ref <150)

## 2022-02-18 LAB — URINALYSIS DIPSTICK
GLUCOSE,UA: NEGATIVE
NITRITE: NEGATIVE
PROTEIN,UA: NEGATIVE
URINE ASCORBIC ACID, UA: NEGATIVE
URINE BILE: NEGATIVE
URINE BLOOD: NEGATIVE

## 2022-02-18 LAB — COMPREHENSIVE METABOLIC PANEL
ALK PHOSPHATASE: 142 U/L — ABNORMAL HIGH (ref 25–110)
ALT: 198 U/L — ABNORMAL HIGH (ref 7–56)
ANION GAP: 12 K/UL (ref 3–12)
AST: 101 U/L — ABNORMAL HIGH (ref 7–40)
CHLORIDE: 103 MMOL/L (ref 98–110)
CO2: 23 MMOL/L — ABNORMAL LOW (ref 21–30)
EGFR: >60 mL/min (ref >60)
SODIUM: 138 MMOL/L (ref 137–147)

## 2022-02-18 LAB — PROTIME INR (PT): PROTIME: 11 s (ref 9.5–14.2)

## 2022-02-18 LAB — URINALYSIS, MICROSCOPIC

## 2022-02-18 LAB — CBC AND DIFF
RBC COUNT: 4.5 M/UL (ref 4.0–5.0)
WBC COUNT: 5.1 K/UL (ref 4.5–11.0)

## 2022-02-18 LAB — MAGNESIUM: MAGNESIUM: 1.8 mg/dL — ABNORMAL LOW (ref 1.6–2.6)

## 2022-02-18 LAB — LACTIC ACID(LACTATE): LACTIC ACID: 0.6 MMOL/L — ABNORMAL HIGH (ref 0.5–2.0)

## 2022-02-18 LAB — PTT (APTT): PTT: 26 s (ref 24.0–36.5)

## 2022-02-18 MED ORDER — MELATONIN 5 MG PO TAB
5 mg | Freq: Every evening | ORAL | 0 refills | Status: AC | PRN
Start: 2022-02-18 — End: ?
  Administered 2022-02-22: 02:00:00 5 mg via ORAL

## 2022-02-18 MED ORDER — ONDANSETRON HCL (PF) 4 MG/2 ML IJ SOLN
4 mg | INTRAVENOUS | 0 refills | Status: AC | PRN
Start: 2022-02-18 — End: ?
  Administered 2022-02-19 – 2022-02-20 (×3): 4 mg via INTRAVENOUS

## 2022-02-18 MED ORDER — SENNOSIDES-DOCUSATE SODIUM 8.6-50 MG PO TAB
1 | Freq: Two times a day (BID) | ORAL | 0 refills | Status: AC
Start: 2022-02-18 — End: ?
  Administered 2022-02-19 – 2022-02-22 (×6): 1 via ORAL

## 2022-02-18 MED ORDER — POLYETHYLENE GLYCOL 3350 17 GRAM PO PWPK
1 | Freq: Every day | ORAL | 0 refills | Status: DC | PRN
Start: 2022-02-18 — End: 2022-02-19

## 2022-02-18 MED ORDER — POTASSIUM CHLORIDE IN WATER 10 MEQ/50 ML IV PGBK
10 meq | INTRAVENOUS | 0 refills | Status: AC
Start: 2022-02-18 — End: ?
  Administered 2022-02-19: 07:00:00 10 meq via INTRAVENOUS

## 2022-02-18 MED ORDER — KETOROLAC 30 MG/ML (1 ML) IJ SOLN
15 mg | INTRAVENOUS | 0 refills | Status: AC | PRN
Start: 2022-02-18 — End: ?
  Administered 2022-02-19 – 2022-02-20 (×4): 15 mg via INTRAVENOUS

## 2022-02-18 MED ORDER — TRAZODONE 50 MG PO TAB
50 mg | Freq: Every evening | ORAL | 0 refills | Status: AC | PRN
Start: 2022-02-18 — End: ?
  Administered 2022-02-19 – 2022-02-22 (×4): 50 mg via ORAL

## 2022-02-18 MED ORDER — VENLAFAXINE 150 MG PO CP24
150 mg | Freq: Every day | ORAL | 0 refills | Status: AC
Start: 2022-02-18 — End: ?
  Administered 2022-02-19 – 2022-02-22 (×4): 150 mg via ORAL

## 2022-02-18 MED ORDER — SENNOSIDES-DOCUSATE SODIUM 8.6-50 MG PO TAB
1 | Freq: Every day | ORAL | 0 refills | Status: DC | PRN
Start: 2022-02-18 — End: 2022-02-19

## 2022-02-18 MED ORDER — POLYETHYLENE GLYCOL 3350 17 GRAM PO PWPK
1 | Freq: Two times a day (BID) | ORAL | 0 refills | Status: AC
Start: 2022-02-18 — End: ?
  Administered 2022-02-22 (×2): 17 g via ORAL

## 2022-02-18 MED ORDER — POTASSIUM CHLORIDE IN WATER 10 MEQ/50 ML IV PGBK
10 meq | INTRAVENOUS | 0 refills | Status: AC
Start: 2022-02-18 — End: ?
  Administered 2022-02-19: 05:00:00 10 meq via INTRAVENOUS

## 2022-02-18 MED ORDER — PIPERACILLIN/TAZOBACTAM 4.5 G/100ML NS IVPB (MB+)
4.5 g | INTRAVENOUS | 0 refills | Status: AC
Start: 2022-02-18 — End: ?
  Administered 2022-02-19 – 2022-02-20 (×10): 4.5 g via INTRAVENOUS

## 2022-02-18 MED ORDER — DEXTROSE 5%-0.45% SODIUM CHLORIDE IV SOLP
1000 mL | Freq: Once | INTRAVENOUS | 0 refills | Status: CP
Start: 2022-02-18 — End: ?
  Administered 2022-02-19: 05:00:00 1000 mL via INTRAVENOUS

## 2022-02-18 MED ORDER — VALSARTAN 80 MG PO TAB
80 mg | Freq: Every day | ORAL | 0 refills | Status: AC
Start: 2022-02-18 — End: ?
  Administered 2022-02-19 – 2022-02-22 (×4): 80 mg via ORAL

## 2022-02-18 MED ORDER — OXYCODONE 5 MG PO TAB
5-10 mg | ORAL | 0 refills | Status: AC | PRN
Start: 2022-02-18 — End: ?
  Administered 2022-02-19 (×2): 10 mg via ORAL

## 2022-02-18 MED ORDER — ONDANSETRON 4 MG PO TBDI
4 mg | ORAL | 0 refills | Status: AC | PRN
Start: 2022-02-18 — End: ?

## 2022-02-18 MED ORDER — ACETAMINOPHEN 325 MG PO TAB
650 mg | ORAL | 0 refills | Status: DC | PRN
Start: 2022-02-18 — End: 2022-02-19

## 2022-02-19 ENCOUNTER — Inpatient Hospital Stay: Admit: 2022-02-19 | Discharge: 2022-02-19 | Payer: MEDICAID

## 2022-02-19 ENCOUNTER — Encounter: Admit: 2022-02-19 | Discharge: 2022-02-19 | Payer: MEDICAID

## 2022-02-19 ENCOUNTER — Inpatient Hospital Stay: Admit: 2022-02-19 | Discharge: 2022-02-18 | Payer: MEDICAID

## 2022-02-19 DIAGNOSIS — J329 Chronic sinusitis, unspecified: Secondary | ICD-10-CM

## 2022-02-19 DIAGNOSIS — I8500 Esophageal varices without bleeding: Secondary | ICD-10-CM

## 2022-02-19 DIAGNOSIS — K219 Gastro-esophageal reflux disease without esophagitis: Secondary | ICD-10-CM

## 2022-02-19 DIAGNOSIS — B1920 Unspecified viral hepatitis C without hepatic coma: Secondary | ICD-10-CM

## 2022-02-19 DIAGNOSIS — K279 Peptic ulcer, site unspecified, unspecified as acute or chronic, without hemorrhage or perforation: Secondary | ICD-10-CM

## 2022-02-19 DIAGNOSIS — K7460 Unspecified cirrhosis of liver: Secondary | ICD-10-CM

## 2022-02-19 DIAGNOSIS — M9902 Segmental and somatic dysfunction of thoracic region: Secondary | ICD-10-CM

## 2022-02-19 DIAGNOSIS — M5136 Other intervertebral disc degeneration, lumbar region: Secondary | ICD-10-CM

## 2022-02-19 DIAGNOSIS — R110 Nausea: Secondary | ICD-10-CM

## 2022-02-19 DIAGNOSIS — K769 Liver disease, unspecified: Secondary | ICD-10-CM

## 2022-02-19 DIAGNOSIS — N200 Calculus of kidney: Secondary | ICD-10-CM

## 2022-02-19 DIAGNOSIS — E119 Type 2 diabetes mellitus without complications: Secondary | ICD-10-CM

## 2022-02-19 DIAGNOSIS — M5416 Radiculopathy, lumbar region: Secondary | ICD-10-CM

## 2022-02-19 DIAGNOSIS — U099 COVID-19 long hauler: Secondary | ICD-10-CM

## 2022-02-19 DIAGNOSIS — Z8679 Personal history of other diseases of the circulatory system: Secondary | ICD-10-CM

## 2022-02-19 DIAGNOSIS — S0592XA Unspecified injury of left eye and orbit, initial encounter: Secondary | ICD-10-CM

## 2022-02-19 DIAGNOSIS — G8929 Other chronic pain: Secondary | ICD-10-CM

## 2022-02-19 DIAGNOSIS — M1990 Unspecified osteoarthritis, unspecified site: Secondary | ICD-10-CM

## 2022-02-19 DIAGNOSIS — F419 Anxiety disorder, unspecified: Secondary | ICD-10-CM

## 2022-02-19 DIAGNOSIS — K209 Esophagitis: Secondary | ICD-10-CM

## 2022-02-19 DIAGNOSIS — F32A Depression: Secondary | ICD-10-CM

## 2022-02-19 DIAGNOSIS — I10 Essential (primary) hypertension: Secondary | ICD-10-CM

## 2022-02-19 DIAGNOSIS — D509 Iron deficiency anemia, unspecified: Secondary | ICD-10-CM

## 2022-02-19 DIAGNOSIS — K449 Diaphragmatic hernia without obstruction or gangrene: Secondary | ICD-10-CM

## 2022-02-19 MED ORDER — FENTANYL CITRATE (PF) 50 MCG/ML IJ SOLN
INTRAVENOUS | 0 refills | Status: DC
Start: 2022-02-19 — End: 2022-02-19
  Administered 2022-02-19: 22:00:00 50 ug via INTRAVENOUS
  Administered 2022-02-19 (×2): 25 ug via INTRAVENOUS

## 2022-02-19 MED ORDER — SUCCINYLCHOLINE CHLORIDE 20 MG/ML IJ SOLN
INTRAVENOUS | 0 refills | Status: DC
Start: 2022-02-19 — End: 2022-02-19
  Administered 2022-02-19: 22:00:00 100 mg via INTRAVENOUS

## 2022-02-19 MED ORDER — LACTATED RINGERS IV SOLP
INTRAVENOUS | 0 refills | Status: DC
Start: 2022-02-19 — End: 2022-02-19

## 2022-02-19 MED ORDER — PROPOFOL INJ 10 MG/ML IV VIAL
INTRAVENOUS | 0 refills | Status: DC
Start: 2022-02-19 — End: 2022-02-19
  Administered 2022-02-19: 22:00:00 150 mg via INTRAVENOUS

## 2022-02-19 MED ORDER — LACTATED RINGERS IV SOLP
INTRAVENOUS | 0 refills | Status: DC
Start: 2022-02-19 — End: 2022-02-19
  Administered 2022-02-19: 22:00:00 via INTRAVENOUS

## 2022-02-19 MED ORDER — LIDOCAINE (PF) 200 MG/10 ML (2 %) IJ SYRG
INTRAVENOUS | 0 refills | Status: DC
Start: 2022-02-19 — End: 2022-02-19
  Administered 2022-02-19: 22:00:00 100 mg via INTRAVENOUS

## 2022-02-19 MED ORDER — EPHEDRINE SULFATE 50 MG/ML IV SOLN
INTRAVENOUS | 0 refills | Status: DC
Start: 2022-02-19 — End: 2022-02-19
  Administered 2022-02-19: 23:00:00 10 mg via INTRAVENOUS

## 2022-02-19 MED ORDER — DEXAMETHASONE SODIUM PHOSPHATE 4 MG/ML IJ SOLN
INTRAVENOUS | 0 refills | Status: DC
Start: 2022-02-19 — End: 2022-02-19
  Administered 2022-02-19: 22:00:00 4 mg via INTRAVENOUS

## 2022-02-19 MED ORDER — PHENYLEPHRINE HCL IN 0.9% NACL 1 MG/10 ML (100 MCG/ML) IV GROUP
INTRAVENOUS | 0 refills | Status: DC
Start: 2022-02-19 — End: 2022-02-19
  Administered 2022-02-19: 23:00:00 100 ug via INTRAVENOUS

## 2022-02-19 MED ORDER — ONDANSETRON HCL (PF) 4 MG/2 ML IJ SOLN
INTRAVENOUS | 0 refills | Status: DC
Start: 2022-02-19 — End: 2022-02-19
  Administered 2022-02-19: 23:00:00 4 mg via INTRAVENOUS

## 2022-02-19 MED ADMIN — DIPHENHYDRAMINE HCL 25 MG PO CAP [2509]: 25 mg | ORAL | @ 14:00:00 | NDC 00904530661

## 2022-02-19 MED ADMIN — SODIUM CHLORIDE 0.9 % IR SOLN [11403]: 1000 mL | @ 22:00:00 | Stop: 2022-02-19 | NDC 00338004804

## 2022-02-19 MED ADMIN — DIPHENHYDRAMINE HCL 25 MG PO CAP [2509]: 25 mg | ORAL | @ 09:00:00 | Stop: 2022-02-19 | NDC 00904723761

## 2022-02-19 MED ADMIN — SODIUM CHLORIDE 0.9 % IV SOLP [27838]: 250 mL | INTRAVENOUS | @ 04:00:00 | Stop: 2022-02-19 | NDC 00338004902

## 2022-02-19 MED ADMIN — IOHEXOL 300 MG IODINE/ML IV SOLN [79156]: 10 mL | INTRAMUSCULAR | @ 23:00:00 | Stop: 2022-02-19 | NDC 00407141361

## 2022-02-19 MED ADMIN — PROCHLORPERAZINE EDISYLATE 5 MG/ML IJ SOLN [6580]: 10 mg | INTRAVENOUS | @ 14:00:00 | NDC 70860077841

## 2022-02-19 NOTE — Progress Notes
1901: Notified Melvyn Novas, MD of patient's arrival to unit

## 2022-02-19 NOTE — Anesthesia Post-Procedure Evaluation
Post-Anesthesia Evaluation    Name: Erin Sanchez      MRN: [id number]     DOB: [birth year]     Age: 61 y.o.     Sex: female   __________________________________________________________________________     Procedure Information     Anesthesia Start Date/Time: 02/19/22 1643    Procedures:       ESOPHAGOGASTRODUODENOSCOPY WITH ENDOSCOPIC ULTRASOUND EXAMINATION - FLEXIBLE      ENDOSCOPIC RETROGRADE CHOLANGIOPANCREATOGRAPHY WITH REMOVAL CALCULI/ DEBRIS FROM BILIARY/ PANCREATIC DUCT    Location: ENDO 1 / ENDO/GI    Surgeons: Eliott Nine, MD          Post-Anesthesia Vitals  BP: 132/70 (08/31 1806)  Temp: 36.8 C (98.3 F) (08/31 1806)  Pulse: 95 (08/31 1806)  Respirations: 10 PER MINUTE (08/31 1806)  SpO2: 94 % (08/31 1806)  SpO2 Pulse: 95 (08/31 1806)  O2 Device: Simple mask (08/31 1806)   Vitals Value Taken Time   BP 132/70 02/19/22 1806   Temp 36.8 C (98.3 F) 02/19/22 1806   Pulse 95 02/19/22 1806   Respirations 10 PER MINUTE 02/19/22 1806   SpO2 94 % 02/19/22 1806   O2 Device Simple mask 02/19/22 1806   ABP     ART BP           Post Anesthesia Evaluation Note    Evaluation location: Pre/Post  Patient participation: recovered; patient participated in evaluation  Level of consciousness: alert  Pain management: adequate    Hydration: normovolemia  Temperature: 36.0C - 38.4C  Airway patency: adequate    Perioperative Events       Post-op nausea and vomiting: no PONV    Postoperative Status  Cardiovascular status: hemodynamically stable  Respiratory status: spontaneous ventilation  Follow-up needed: none        Perioperative Events  There were no known notable events for this encounter.

## 2022-02-19 NOTE — Anesthesia Pre-Procedure Evaluation
Anesthesia Pre-Procedure Evaluation    Name: Erin Sanchez      MRN: [id number]     DOB: [birth year]     Age: 61 y.o.     Sex: female   _________________________________________________________________________     Procedure Info:   Procedure Information     Date/Time: 02/19/22 1638    Procedures:       ESOPHAGOGASTRODUODENOSCOPY WITH ENDOSCOPIC ULTRASOUND EXAMINATION - FLEXIBLE      ENDOSCOPIC RETROGRADE CHOLANGIOPANCREATOGRAPHY WITH BIOPSY    Location: ENDO 1 / ENDO/GI    Surgeons: Eliott Nine, MD          Physical Assessment  Vital Signs (last filed in past 24 hours):  BP: 136/76 (08/31 1038)  Temp: 36.7 ?C (98.1 ?F) (08/31 1539)  Pulse: 83 (08/31 1038)  Respirations: 16 PER MINUTE (08/31 1038)  SpO2: 94 % (08/31 1038)  O2 Device: None (Room air) (08/31 1539)  Height: 167.6 cm (5' 6) (08/30 1901)  Weight: 79.6 kg (175 lb 6.4 oz) (08/30 1901)      Patient History   Allergies   Allergen Reactions   ? Codeine ITCHING   ? Ciprofloxacin UNKNOWN     Used for an eye infection, caused patient to be very ill.        Current Medications    Medication Directions   albuterol sulfate (PROAIR HFA) 90 mcg/actuation HFA aerosol inhaler Inhale two puffs by mouth into the lungs daily as needed for Wheezing or Shortness of Breath. Shake well before use.   diclofenac sodium (VOLTAREN) 1 % topical gel Apply four g topically to affected area four times daily.  Patient taking differently: Apply 4 g topically to affected area four times daily as needed.   lidocaine (LIDODERM) 5 % topical patch Apply two patches topically to affected area daily. Apply patch for 12 hours, then remove for 12 hours before repeating.   metFORMIN-XR (GLUCOPHAGE XR) 500 mg extended release tablet Take two tablets by mouth daily.   nabumetone (RELAFEN) 750 mg tablet TAKE 1 TABLET BY MOUTH TWICE A DAY   omeprazole DR (PRILOSEC) 40 mg capsule TAKE 1 CAPSULE BY MOUTH TWICE A DAY BEFORE MEALS   polyethylene glycol 3350 (MIRALAX) 17 g packet Take one packet by mouth daily.   potassium chloride SR (K-DUR) 20 mEq tablet Take one tablet by mouth twice daily.   traZODone (DESYREL) 50 mg tablet Take one-half tablet to two tablets by mouth at bedtime as needed.   valsartan (DIOVAN) 80 mg tablet Take one tablet by mouth every evening.   venlafaxine XR (EFFEXOR XR) 150 mg capsule Take one capsule by mouth daily.       Review of Systems/Medical History      Patient summary reviewed  Nursing notes reviewed  Difficult IV access    PONV Screening: Non-smoker and Female sex  No history of anesthetic complications  No family history of anesthetic complications      Airway - negative        Pulmonary      Not a current smoker (Quit 1999, 4 pYH )        No indications/hx of asthma    no COPD      COVID -19: 05/2020 with persistent dyspnea. Reports dyspnea improved with pulmonary rehab. Albuterol PRN, last use 10/2020     chronic sinusitis      Cardiovascular         Exercise tolerance: >4 METS (7.99 METS per [facility] )  Beta Blocker therapy: No      Beta blockers within 24 hours: n/a      Hypertension (diovan ),             No hx of coronary artery disease        No palpitations        No dysrhythmias    No angina        No hyperlipidemia      No orthopnea      No indications/hx of PND      No dyspnea on exertion      No syncope      GI/Hepatic/Renal         Hiatal hernia ( large mixed hiatal hernia, Paraesophageal hernia )        GERD, poorly controlled      Peptic ulcer disease      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          Electrolyte problem (hypokalemia, 3.4 on 02/19/22)      Nausea (chronic )        Chronic sinusitis s/p tx           Hepatitis (s/p tx ) C      Neuro/Psych       No seizures      No CVA      No headaches      Sensory deficit (decreased vision left eye )        Psychiatric history ( controlled wtih effexor  )          Depression          Anxiety      Musculoskeletal         Back pain (lumbar radiculopathy, sciatica )      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  ):         Endocrine/Other       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), type 2      Most recent Hgb A1C:< 7.2        No hypothyroidism      Anemia (s/p iron infusion 06/220)      Blood dyscrasia (pancytopenia )      No history of blood transfusion        No autoimmune disease      No malignancy      Obesity (BMI 30 )        Last Hgb 10.8    Constitution - negative       Physical Exam    Airway Findings      Mallampati: II      TM distance: >3 FB      Neck ROM: full      Mouth opening: good    Dental Findings:       Upper dentures    Cardiovascular Findings:       Rhythm: regular      Rate: normal      No murmur, no carotid bruit, no peripheral edema    Pulmonary Findings:       Breath sounds clear to auscultation.    Abdominal Findings:       Obese    Neurological Findings:       Alert and oriented x 3    Constitutional findings:       No acute distress       Diagnostic Tests  Hematology:   Lab Results  Component Value Date    HGB 13.5 02/19/2022    HCT 39.9 02/19/2022    PLTCT 127 02/19/2022    WBC 3.6 02/19/2022    NEUT 71 02/18/2022    ANC 3.63 02/18/2022    LYMPH 21 02/19/2022    ALC 0.88 02/18/2022    MONA 9 02/18/2022    AMC 0.46 02/18/2022    EOSA 2 02/18/2022    ABC 0.04 02/18/2022    BASOPHILS 1 02/19/2022    MCV 90.8 02/19/2022    MCH 30.7 02/19/2022    MCHC 33.8 02/19/2022    MPV 8.8 02/19/2022    RDW 14.0 02/19/2022         General Chemistry:   Lab Results   Component Value Date    NA 136 02/19/2022    K 3.4 02/19/2022    CL 101 02/19/2022    CO2 26 02/19/2022    GAP 9 02/19/2022    BUN 5 02/19/2022    CR 0.56 02/19/2022    GLU 132 02/19/2022    CA 8.4 02/19/2022    ALBUMIN 3.4 02/19/2022    LACTIC 0.6 02/18/2022    MG 1.8 02/18/2022    TOTBILI 5.1 02/19/2022    PO4 2.6 02/18/2022      Coagulation:   Lab Results   Component Value Date    PT 11.9 02/18/2022    PTT 26.6 02/18/2022    INR 1.1 02/18/2022     EKG 02/2020: Carotid artery [place] 07/2020 (CE):   1. No evidence of significant stenosis of either ICA.   2. Normal antegrade blood flow in bilateral vertebral arteries.    Anesthesia Plan    ASA score: 3   Plan: general  Induction method: intravenous      Informed Consent  Anesthetic plan and risks discussed with patient.  Use of blood products discussed with patient  Blood Consent: consented      Plan discussed with: anesthesiologist and CRNA.  Comments: (Pt is difficult stick and states IVs don't tend to last long on her   )     CMP, CBC reviewed from 12/20/2020   ROI: none  consult: none     PAC Plan      Alerts

## 2022-02-20 ENCOUNTER — Encounter: Admit: 2022-02-20 | Discharge: 2022-02-20 | Payer: MEDICAID

## 2022-02-20 ENCOUNTER — Inpatient Hospital Stay: Admit: 2022-02-20 | Discharge: 2022-02-20 | Payer: MEDICAID

## 2022-02-20 DIAGNOSIS — K209 Esophagitis: Secondary | ICD-10-CM

## 2022-02-20 DIAGNOSIS — K219 Gastro-esophageal reflux disease without esophagitis: Secondary | ICD-10-CM

## 2022-02-20 DIAGNOSIS — F419 Anxiety disorder, unspecified: Secondary | ICD-10-CM

## 2022-02-20 DIAGNOSIS — M5416 Radiculopathy, lumbar region: Secondary | ICD-10-CM

## 2022-02-20 DIAGNOSIS — D509 Iron deficiency anemia, unspecified: Secondary | ICD-10-CM

## 2022-02-20 DIAGNOSIS — K7460 Unspecified cirrhosis of liver: Secondary | ICD-10-CM

## 2022-02-20 DIAGNOSIS — M5136 Other intervertebral disc degeneration, lumbar region: Secondary | ICD-10-CM

## 2022-02-20 DIAGNOSIS — Z8679 Personal history of other diseases of the circulatory system: Secondary | ICD-10-CM

## 2022-02-20 DIAGNOSIS — M9902 Segmental and somatic dysfunction of thoracic region: Secondary | ICD-10-CM

## 2022-02-20 DIAGNOSIS — I8500 Esophageal varices without bleeding: Secondary | ICD-10-CM

## 2022-02-20 DIAGNOSIS — K279 Peptic ulcer, site unspecified, unspecified as acute or chronic, without hemorrhage or perforation: Secondary | ICD-10-CM

## 2022-02-20 DIAGNOSIS — U099 COVID-19 long hauler: Secondary | ICD-10-CM

## 2022-02-20 DIAGNOSIS — B1920 Unspecified viral hepatitis C without hepatic coma: Secondary | ICD-10-CM

## 2022-02-20 DIAGNOSIS — N200 Calculus of kidney: Secondary | ICD-10-CM

## 2022-02-20 DIAGNOSIS — I10 Essential (primary) hypertension: Secondary | ICD-10-CM

## 2022-02-20 DIAGNOSIS — F32A Depression: Secondary | ICD-10-CM

## 2022-02-20 DIAGNOSIS — S0592XA Unspecified injury of left eye and orbit, initial encounter: Secondary | ICD-10-CM

## 2022-02-20 DIAGNOSIS — J329 Chronic sinusitis, unspecified: Secondary | ICD-10-CM

## 2022-02-20 DIAGNOSIS — K769 Liver disease, unspecified: Secondary | ICD-10-CM

## 2022-02-20 DIAGNOSIS — E119 Type 2 diabetes mellitus without complications: Secondary | ICD-10-CM

## 2022-02-20 DIAGNOSIS — M1990 Unspecified osteoarthritis, unspecified site: Secondary | ICD-10-CM

## 2022-02-20 DIAGNOSIS — K449 Diaphragmatic hernia without obstruction or gangrene: Secondary | ICD-10-CM

## 2022-02-20 DIAGNOSIS — G8929 Other chronic pain: Secondary | ICD-10-CM

## 2022-02-20 DIAGNOSIS — R110 Nausea: Secondary | ICD-10-CM

## 2022-02-20 MED ORDER — MIDAZOLAM 1 MG/ML IJ SOLN
INTRAVENOUS | 0 refills | Status: DC
Start: 2022-02-20 — End: 2022-02-20
  Administered 2022-02-20: 13:00:00 2 mg via INTRAVENOUS

## 2022-02-20 MED ORDER — SUGAMMADEX 100 MG/ML IV SOLN
INTRAVENOUS | 0 refills | Status: DC
Start: 2022-02-20 — End: 2022-02-20
  Administered 2022-02-20: 15:00:00 200 mg via INTRAVENOUS

## 2022-02-20 MED ORDER — PHENYLEPHRINE HCL IN 0.9% NACL 1 MG/10 ML (100 MCG/ML) IV SYRG
INTRAVENOUS | 0 refills | Status: DC
Start: 2022-02-20 — End: 2022-02-20
  Administered 2022-02-20: 13:00:00 100 ug via INTRAVENOUS

## 2022-02-20 MED ORDER — DEXAMETHASONE SODIUM PHOSPHATE 4 MG/ML IJ SOLN
INTRAVENOUS | 0 refills | Status: DC
Start: 2022-02-20 — End: 2022-02-20
  Administered 2022-02-20: 13:00:00 4 mg via INTRAVENOUS

## 2022-02-20 MED ORDER — FENTANYL CITRATE (PF) 50 MCG/ML IJ SOLN
INTRAVENOUS | 0 refills | Status: DC
Start: 2022-02-20 — End: 2022-02-20
  Administered 2022-02-20: 13:00:00 100 ug via INTRAVENOUS

## 2022-02-20 MED ORDER — LIDOCAINE (PF) 200 MG/10 ML (2 %) IJ SYRG
INTRAVENOUS | 0 refills | Status: DC
Start: 2022-02-20 — End: 2022-02-20
  Administered 2022-02-20: 13:00:00 60 mg via INTRAVENOUS

## 2022-02-20 MED ORDER — DEXMEDETOMIDINE IN 0.9 % NACL 20 MCG/5 ML (4 MCG/ML) IV SYRG
INTRAVENOUS | 0 refills | Status: DC
Start: 2022-02-20 — End: 2022-02-20
  Administered 2022-02-20 (×3): 4 ug via INTRAVENOUS
  Administered 2022-02-20: 14:00:00 8 ug via INTRAVENOUS

## 2022-02-20 MED ORDER — CEFAZOLIN 1 GRAM IJ SOLR
INTRAVENOUS | 0 refills | Status: DC
Start: 2022-02-20 — End: 2022-02-20
  Administered 2022-02-20: 13:00:00 2 g via INTRAVENOUS

## 2022-02-20 MED ORDER — SUCCINYLCHOLINE CHLORIDE 20 MG/ML IJ SOLN
INTRAVENOUS | 0 refills | Status: DC
Start: 2022-02-20 — End: 2022-02-20
  Administered 2022-02-20: 13:00:00 80 mg via INTRAVENOUS

## 2022-02-20 MED ORDER — HYDROMORPHONE (PF) 2 MG/ML IJ SYRG
INTRAVENOUS | 0 refills | Status: DC
Start: 2022-02-20 — End: 2022-02-20
  Administered 2022-02-20 (×4): .5 mg via INTRAVENOUS

## 2022-02-20 MED ORDER — PROPOFOL INJ 10 MG/ML IV VIAL
INTRAVENOUS | 0 refills | Status: DC
Start: 2022-02-20 — End: 2022-02-20
  Administered 2022-02-20: 13:00:00 160 mg via INTRAVENOUS
  Administered 2022-02-20: 15:00:00 40 mg via INTRAVENOUS

## 2022-02-20 MED ORDER — ARTIFICIAL TEARS (PF) SINGLE DOSE DROPS GROUP
OPHTHALMIC | 0 refills | Status: DC
Start: 2022-02-20 — End: 2022-02-20
  Administered 2022-02-20: 13:00:00 2 [drp] via OPHTHALMIC

## 2022-02-20 MED ORDER — ROCURONIUM 10 MG/ML IV SOLN
INTRAVENOUS | 0 refills | Status: DC
Start: 2022-02-20 — End: 2022-02-20
  Administered 2022-02-20: 14:00:00 20 mg via INTRAVENOUS
  Administered 2022-02-20: 13:00:00 40 mg via INTRAVENOUS
  Administered 2022-02-20: 13:00:00 10 mg via INTRAVENOUS

## 2022-02-20 MED ADMIN — DIPHENHYDRAMINE HCL 25 MG PO CAP [2509]: 25 mg | ORAL | @ 15:00:00 | Stop: 2022-02-21 | NDC 00904723761

## 2022-02-20 MED ADMIN — ACETAMINOPHEN 500 MG PO TAB [102]: 1000 mg | ORAL | @ 22:00:00 | NDC 00904673061

## 2022-02-20 MED ADMIN — DIPHENHYDRAMINE HCL 50 MG/ML IJ SOLN [2508]: 25 mg | INTRAVENOUS | @ 16:00:00 | Stop: 2022-02-20 | NDC 63323066400

## 2022-02-20 MED ADMIN — FENTANYL CITRATE (PF) 50 MCG/ML IJ SOLN [3037]: 25 ug | INTRAVENOUS | @ 15:00:00 | Stop: 2022-02-20 | NDC 00409909412

## 2022-02-20 MED ADMIN — LACTATED RINGERS IV SOLP [4318]: 1000 mL | INTRAVENOUS | @ 13:00:00 | Stop: 2022-02-20 | NDC 00338011704

## 2022-02-20 MED ADMIN — FENTANYL CITRATE (PF) 50 MCG/ML IJ SOLN [3037]: 25 ug | INTRAVENOUS | @ 16:00:00 | Stop: 2022-02-20 | NDC 00409909412

## 2022-02-20 MED ADMIN — OXYCODONE 5 MG PO TAB [10814]: 5 mg | ORAL | @ 15:00:00 | Stop: 2022-02-20 | NDC 68084035411

## 2022-02-20 MED ADMIN — LACTATED RINGERS IV SOLP [4318]: 1000.000 mL | INTRAVENOUS | @ 13:00:00 | Stop: 2022-02-20 | NDC 00338011704

## 2022-02-20 MED ADMIN — BUPIVACAINE HCL 0.25 % (2.5 MG/ML) IJ SOLN [1222]: 25 mL | INTRAMUSCULAR | @ 15:00:00 | Stop: 2022-02-20 | NDC 63323046501

## 2022-02-20 MED ADMIN — DIPHENHYDRAMINE HCL 25 MG PO CAP [2509]: 25 mg | ORAL | @ 22:00:00 | Stop: 2022-02-21 | NDC 00904530661

## 2022-02-20 MED ADMIN — HYDROMORPHONE 2 MG PO TAB [3760]: 4 mg | ORAL | @ 20:00:00 | Stop: 2022-02-21 | NDC 42858030125

## 2022-02-20 MED ADMIN — ACETAMINOPHEN 500 MG PO TAB [102]: 1000 mg | ORAL | @ 16:00:00 | NDC 00904672080

## 2022-02-21 ENCOUNTER — Inpatient Hospital Stay: Admit: 2022-02-21 | Discharge: 2022-02-21 | Payer: MEDICAID

## 2022-02-21 MED ADMIN — DIPHENHYDRAMINE HCL 50 MG/ML IJ SOLN [2508]: 25 mg | INTRAVENOUS | @ 04:00:00 | Stop: 2022-02-21 | NDC 63323066400

## 2022-02-21 MED ADMIN — OXYCODONE 5 MG PO TAB [10814]: 10 mg | ORAL | @ 14:00:00 | NDC 68084035411

## 2022-02-21 MED ADMIN — POTASSIUM PHOSPHATE, MONOBASIC 500 MG PO TBSO [85136]: 2 | ORAL | @ 15:00:00 | Stop: 2022-02-21 | NDC 39328000810

## 2022-02-21 MED ADMIN — MAGNESIUM SULFATE IN D5W 1 GRAM/100 ML IV PGBK [166578]: 1 g | INTRAVENOUS | @ 15:00:00 | Stop: 2022-02-21 | NDC 00409672723

## 2022-02-21 MED ADMIN — HYDROMORPHONE 2 MG PO TAB [3760]: 4 mg | ORAL | @ 02:00:00 | Stop: 2022-02-21 | NDC 42858030125

## 2022-02-21 MED ADMIN — DIPHENHYDRAMINE HCL 50 MG PO CAP [2510]: 50 mg | ORAL | @ 09:00:00 | NDC 00904205661

## 2022-02-21 MED ADMIN — DIPHENHYDRAMINE HCL 50 MG PO CAP [2510]: 50 mg | ORAL | @ 20:00:00 | NDC 00904205661

## 2022-02-21 MED ADMIN — ACETAMINOPHEN 500 MG PO TAB [102]: 1000 mg | ORAL | @ 04:00:00 | NDC 00904673061

## 2022-02-21 MED ADMIN — OXYCODONE 5 MG PO TAB [10814]: 10 mg | ORAL | @ 09:00:00 | NDC 68084035411

## 2022-02-21 MED ADMIN — DIPHENHYDRAMINE HCL 50 MG PO CAP [2510]: 50 mg | ORAL | @ 14:00:00 | NDC 00904205661

## 2022-02-21 MED ADMIN — ACETAMINOPHEN 500 MG PO TAB [102]: 1000 mg | ORAL | @ 15:00:00 | NDC 00904673061

## 2022-02-21 MED ADMIN — OXYCODONE 5 MG PO TAB [10814]: 10 mg | ORAL | @ 20:00:00 | NDC 68084035411

## 2022-02-21 MED ADMIN — ACETAMINOPHEN 500 MG PO TAB [102]: 1000 mg | ORAL | @ 23:00:00 | NDC 00904673061

## 2022-02-21 MED ADMIN — DIPHENHYDRAMINE HCL 25 MG PO CAP [2509]: 25 mg | ORAL | @ 02:00:00 | Stop: 2022-02-21 | NDC 00904530661

## 2022-02-22 ENCOUNTER — Encounter: Admit: 2022-02-22 | Discharge: 2022-02-22 | Payer: MEDICAID

## 2022-02-22 DIAGNOSIS — K449 Diaphragmatic hernia without obstruction or gangrene: Secondary | ICD-10-CM

## 2022-02-22 DIAGNOSIS — D509 Iron deficiency anemia, unspecified: Secondary | ICD-10-CM

## 2022-02-22 DIAGNOSIS — M9902 Segmental and somatic dysfunction of thoracic region: Secondary | ICD-10-CM

## 2022-02-22 DIAGNOSIS — K209 Esophagitis: Secondary | ICD-10-CM

## 2022-02-22 DIAGNOSIS — U099 COVID-19 long hauler: Secondary | ICD-10-CM

## 2022-02-22 DIAGNOSIS — S0592XA Unspecified injury of left eye and orbit, initial encounter: Secondary | ICD-10-CM

## 2022-02-22 DIAGNOSIS — F419 Anxiety disorder, unspecified: Secondary | ICD-10-CM

## 2022-02-22 DIAGNOSIS — K7460 Unspecified cirrhosis of liver: Secondary | ICD-10-CM

## 2022-02-22 DIAGNOSIS — I8500 Esophageal varices without bleeding: Secondary | ICD-10-CM

## 2022-02-22 DIAGNOSIS — F32A Depression: Secondary | ICD-10-CM

## 2022-02-22 DIAGNOSIS — M5416 Radiculopathy, lumbar region: Secondary | ICD-10-CM

## 2022-02-22 DIAGNOSIS — K769 Liver disease, unspecified: Secondary | ICD-10-CM

## 2022-02-22 DIAGNOSIS — M1990 Unspecified osteoarthritis, unspecified site: Secondary | ICD-10-CM

## 2022-02-22 DIAGNOSIS — N200 Calculus of kidney: Secondary | ICD-10-CM

## 2022-02-22 DIAGNOSIS — K279 Peptic ulcer, site unspecified, unspecified as acute or chronic, without hemorrhage or perforation: Secondary | ICD-10-CM

## 2022-02-22 DIAGNOSIS — I10 Essential (primary) hypertension: Secondary | ICD-10-CM

## 2022-02-22 DIAGNOSIS — K219 Gastro-esophageal reflux disease without esophagitis: Secondary | ICD-10-CM

## 2022-02-22 DIAGNOSIS — J329 Chronic sinusitis, unspecified: Secondary | ICD-10-CM

## 2022-02-22 DIAGNOSIS — M5136 Other intervertebral disc degeneration, lumbar region: Secondary | ICD-10-CM

## 2022-02-22 DIAGNOSIS — R110 Nausea: Secondary | ICD-10-CM

## 2022-02-22 DIAGNOSIS — E119 Type 2 diabetes mellitus without complications: Secondary | ICD-10-CM

## 2022-02-22 DIAGNOSIS — G8929 Other chronic pain: Secondary | ICD-10-CM

## 2022-02-22 DIAGNOSIS — Z8679 Personal history of other diseases of the circulatory system: Secondary | ICD-10-CM

## 2022-02-22 DIAGNOSIS — B1920 Unspecified viral hepatitis C without hepatic coma: Secondary | ICD-10-CM

## 2022-02-22 MED ADMIN — ACETAMINOPHEN 500 MG PO TAB [102]: 1000 mg | ORAL | @ 09:00:00 | Stop: 2022-02-22 | NDC 00904673061

## 2022-02-22 MED ADMIN — ACETAMINOPHEN 500 MG PO TAB [102]: 1000 mg | ORAL | @ 15:00:00 | Stop: 2022-02-22 | NDC 00904673061

## 2022-02-22 MED ADMIN — DIPHENHYDRAMINE HCL 50 MG PO CAP [2510]: 50 mg | ORAL | @ 04:00:00 | NDC 00904205661

## 2022-02-22 MED ADMIN — OXYCODONE 5 MG PO TAB [10814]: 10 mg | ORAL | @ 09:00:00 | Stop: 2022-02-22 | NDC 68084035411

## 2022-02-22 MED ADMIN — DIPHENHYDRAMINE HCL 50 MG PO CAP [2510]: 50 mg | ORAL | @ 09:00:00 | Stop: 2022-02-22 | NDC 00904205661

## 2022-02-22 MED ADMIN — OXYCODONE 5 MG PO TAB [10814]: 10 mg | ORAL | @ 14:00:00 | Stop: 2022-02-22 | NDC 68084035411

## 2022-02-22 MED ADMIN — OXYCODONE 5 MG PO TAB [10814]: 10 mg | ORAL | @ 02:00:00 | NDC 68084035411

## 2022-02-22 MED ADMIN — DIPHENHYDRAMINE HCL 50 MG PO CAP [2510]: 50 mg | ORAL | NDC 00904205661

## 2022-02-22 MED ADMIN — DIPHENHYDRAMINE HCL 50 MG PO CAP [2510]: 50 mg | ORAL | @ 14:00:00 | Stop: 2022-02-22 | NDC 00904205661

## 2022-02-22 MED ADMIN — ACETAMINOPHEN 500 MG PO TAB [102]: 1000 mg | ORAL | @ 04:00:00 | NDC 00904673061

## 2022-02-22 MED FILL — POLYETHYLENE GLYCOL 3350 17 GRAM PO PWPK: 17 g | ORAL | 12 days supply | Qty: 24 | Fill #1 | Status: CP

## 2022-02-22 MED FILL — DIPHENHYDRAMINE HCL 50 MG PO CAP: 50 mg | ORAL | 5 days supply | Qty: 30 | Fill #1 | Status: CP

## 2022-02-22 MED FILL — OXYCODONE 5 MG PO TAB: 5 mg | ORAL | 3 days supply | Qty: 15 | Fill #1 | Status: CP

## 2022-02-22 MED FILL — ACETAMINOPHEN 500 MG PO TAB: 500 mg | ORAL | 15 days supply | Qty: 90 | Fill #1 | Status: CP

## 2022-02-22 MED FILL — SENNOSIDES-DOCUSATE SODIUM 8.6-50 MG PO TAB: ORAL | 30 days supply | Qty: 60 | Fill #1 | Status: CP

## 2022-03-10 ENCOUNTER — Encounter: Admit: 2022-03-10 | Discharge: 2022-03-10 | Payer: MEDICAID

## 2022-03-10 ENCOUNTER — Ambulatory Visit: Admit: 2022-03-10 | Discharge: 2022-03-11 | Payer: MEDICAID

## 2022-03-10 DIAGNOSIS — I8500 Esophageal varices without bleeding: Secondary | ICD-10-CM

## 2022-03-10 DIAGNOSIS — K449 Diaphragmatic hernia without obstruction or gangrene: Secondary | ICD-10-CM

## 2022-03-10 DIAGNOSIS — J329 Chronic sinusitis, unspecified: Secondary | ICD-10-CM

## 2022-03-10 DIAGNOSIS — E119 Type 2 diabetes mellitus without complications: Secondary | ICD-10-CM

## 2022-03-10 DIAGNOSIS — G8929 Other chronic pain: Secondary | ICD-10-CM

## 2022-03-10 DIAGNOSIS — M5136 Other intervertebral disc degeneration, lumbar region: Secondary | ICD-10-CM

## 2022-03-10 DIAGNOSIS — K279 Peptic ulcer, site unspecified, unspecified as acute or chronic, without hemorrhage or perforation: Secondary | ICD-10-CM

## 2022-03-10 DIAGNOSIS — K219 Gastro-esophageal reflux disease without esophagitis: Secondary | ICD-10-CM

## 2022-03-10 DIAGNOSIS — M1990 Unspecified osteoarthritis, unspecified site: Secondary | ICD-10-CM

## 2022-03-10 DIAGNOSIS — B1920 Unspecified viral hepatitis C without hepatic coma: Secondary | ICD-10-CM

## 2022-03-10 DIAGNOSIS — K769 Liver disease, unspecified: Secondary | ICD-10-CM

## 2022-03-10 DIAGNOSIS — S0592XA Unspecified injury of left eye and orbit, initial encounter: Secondary | ICD-10-CM

## 2022-03-10 DIAGNOSIS — M9902 Segmental and somatic dysfunction of thoracic region: Secondary | ICD-10-CM

## 2022-03-10 DIAGNOSIS — R110 Nausea: Secondary | ICD-10-CM

## 2022-03-10 DIAGNOSIS — I10 Essential (primary) hypertension: Secondary | ICD-10-CM

## 2022-03-10 DIAGNOSIS — K7460 Unspecified cirrhosis of liver: Secondary | ICD-10-CM

## 2022-03-10 DIAGNOSIS — Z8679 Personal history of other diseases of the circulatory system: Secondary | ICD-10-CM

## 2022-03-10 DIAGNOSIS — D509 Iron deficiency anemia, unspecified: Secondary | ICD-10-CM

## 2022-03-10 DIAGNOSIS — F419 Anxiety disorder, unspecified: Secondary | ICD-10-CM

## 2022-03-10 DIAGNOSIS — K209 Esophagitis: Secondary | ICD-10-CM

## 2022-03-10 DIAGNOSIS — N200 Calculus of kidney: Secondary | ICD-10-CM

## 2022-03-10 DIAGNOSIS — M5416 Radiculopathy, lumbar region: Secondary | ICD-10-CM

## 2022-03-10 DIAGNOSIS — F32A Depression: Secondary | ICD-10-CM

## 2022-03-10 DIAGNOSIS — U099 COVID-19 long hauler: Secondary | ICD-10-CM

## 2022-03-11 DIAGNOSIS — K8050 Calculus of bile duct without cholangitis or cholecystitis without obstruction: Secondary | ICD-10-CM

## 2022-03-11 DIAGNOSIS — K819 Cholecystitis, unspecified: Secondary | ICD-10-CM

## 2022-04-24 ENCOUNTER — Encounter: Admit: 2022-04-24 | Discharge: 2022-04-24 | Payer: MEDICAID

## 2022-06-23 ENCOUNTER — Encounter: Admit: 2022-06-23 | Discharge: 2022-06-23 | Payer: MEDICAID

## 2022-12-15 ENCOUNTER — Encounter: Admit: 2022-12-15 | Discharge: 2022-12-15 | Payer: MEDICAID

## 2023-12-25 IMAGING — MR MRCP
8 of 12 series · 28 of 48 positions shown · non-contrast
Comparison: none

[Series 3: bSSFP · coronal · 6.0mm · 0.74mm/px · 5 of 35 slices shown (1 of 3)]
[im 1/35]
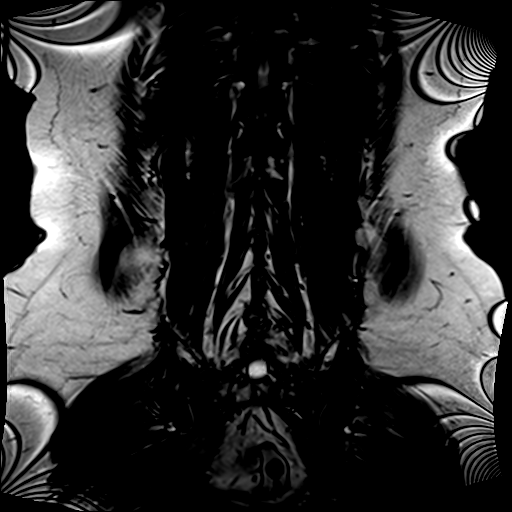
[im 9/35]
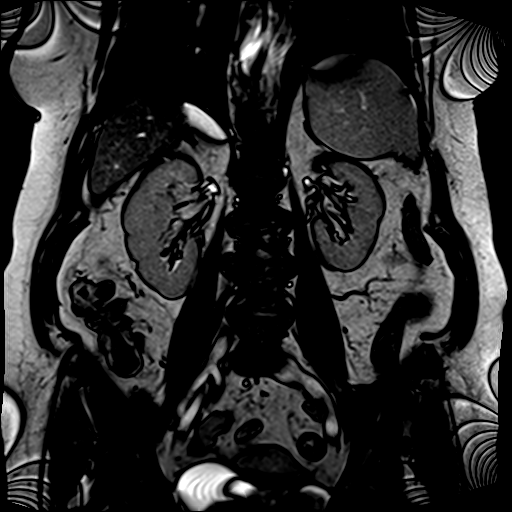
[im 18/35]
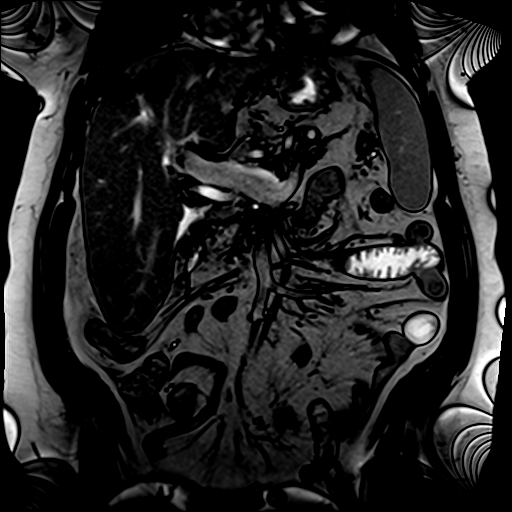
[im 26/35]
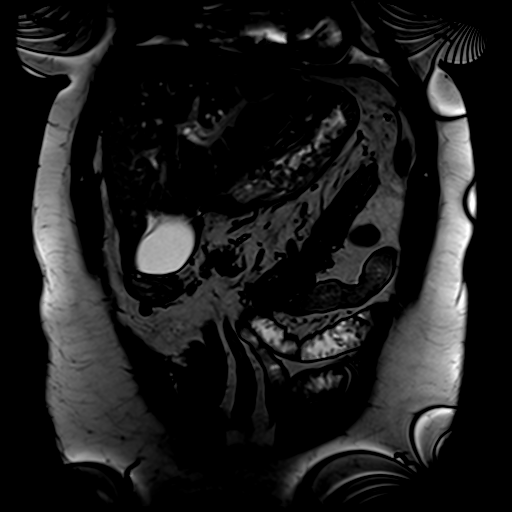
[im 35/35]
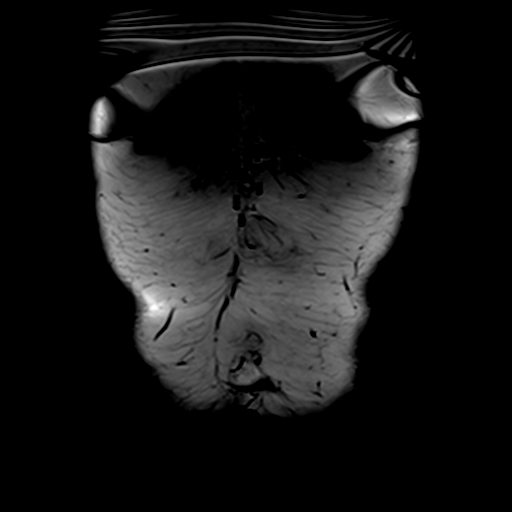

[Series 4: bSSFP · axial · 6.0mm · 0.74mm/px · z∈[-55,+154]mm · 4 of 30 slices shown (2 of 3)]
[im 1/30]
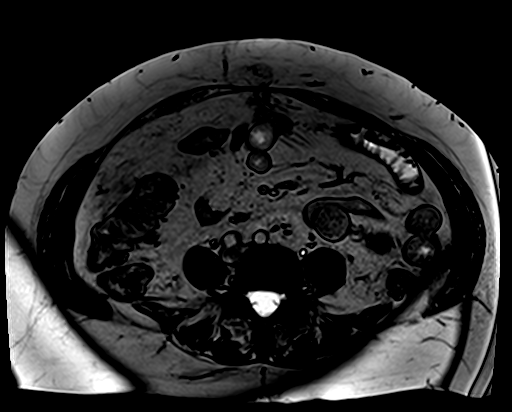
[im 10/30]
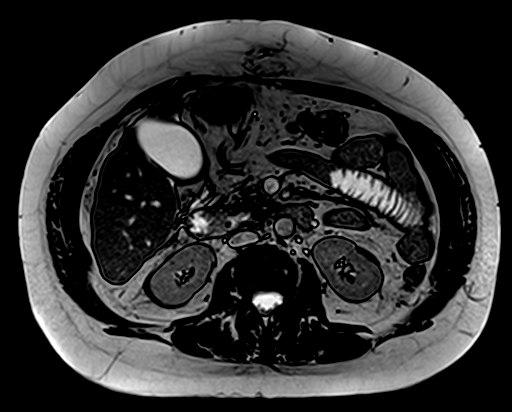
[im 20/30]
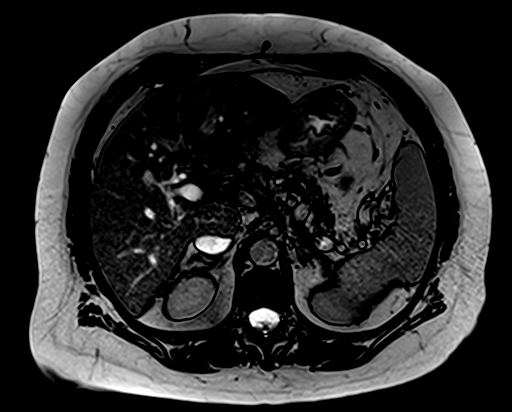
[im 30/30]
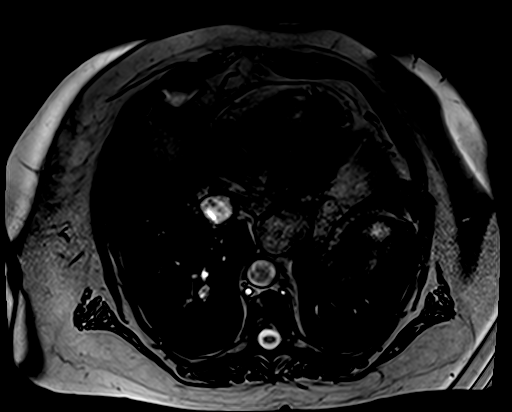

[Series 5: bSSFP · sagittal · 6.0mm · 0.74mm/px · 5 of 35 slices shown (3 of 3)]
[im 1/35]
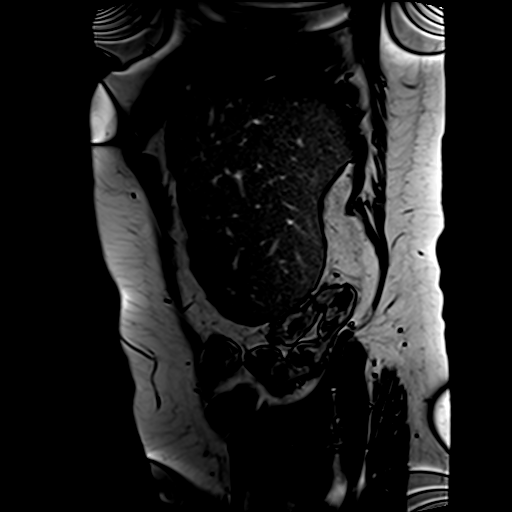
[im 9/35]
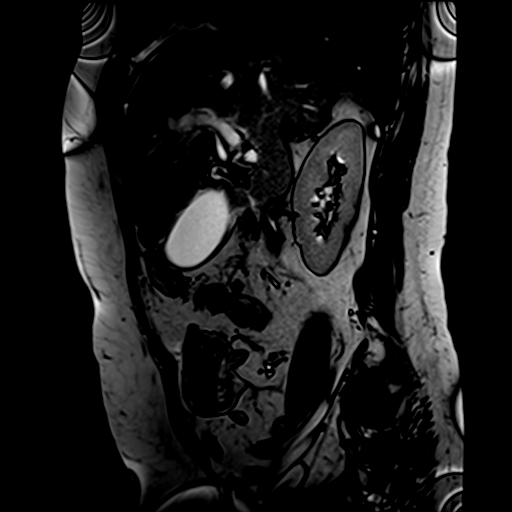
[im 18/35]
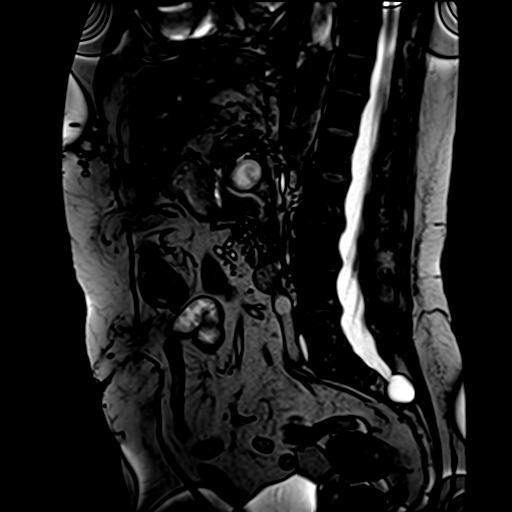
[im 26/35]
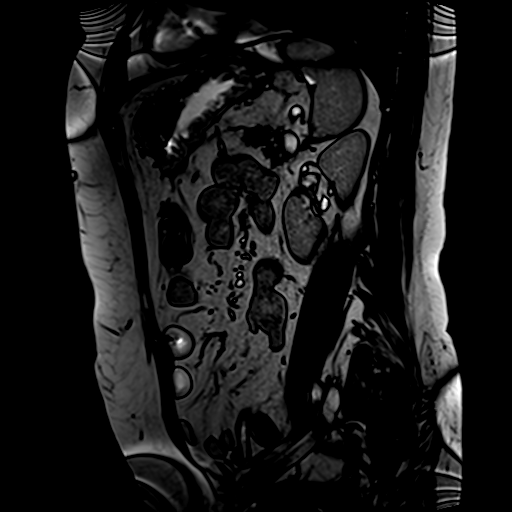
[im 35/35]
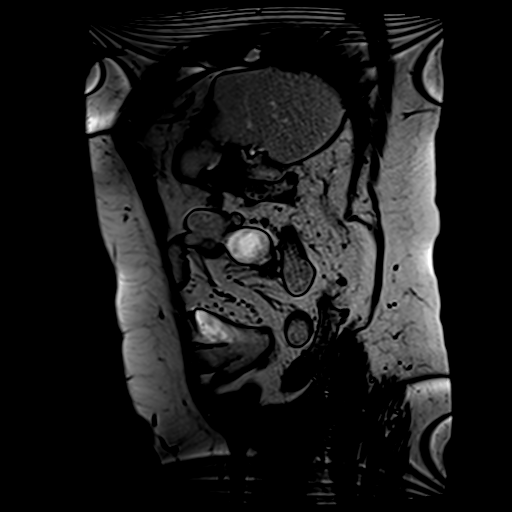

[Series 6: T1 · axial · 6.0mm · 1.48mm/px · z∈[-65,+143]mm · 4 of 30 slices shown]
[im 1/30]
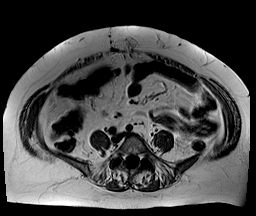
[im 10/30]
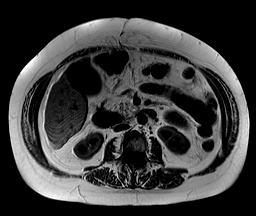
[im 20/30]
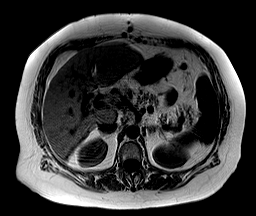
[im 30/30]
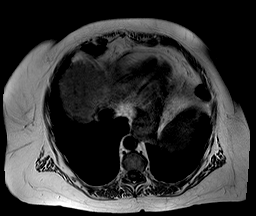

[Series 7: T2 · sagittal · 6.0mm · 1.19mm/px · 7 of 47 slices shown (1 of 2)]
[im 1/47]
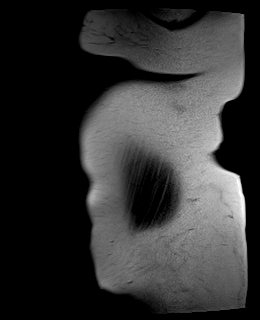
[im 8/47]
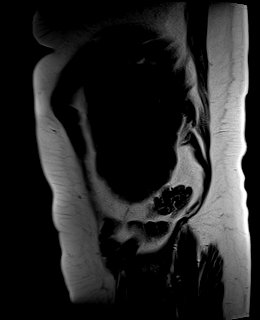
[im 16/47]
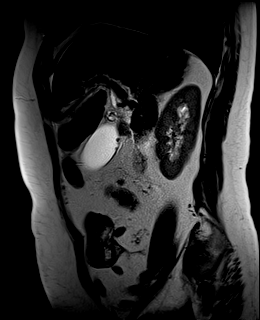
[im 24/47]
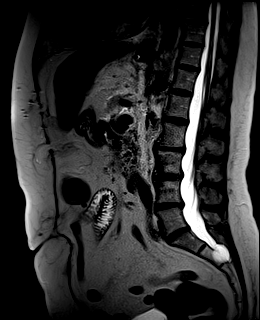
[im 31/47]
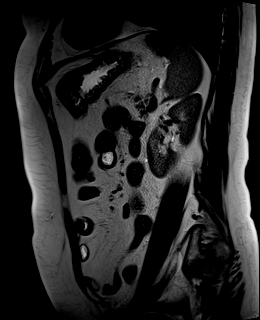
[im 39/47]
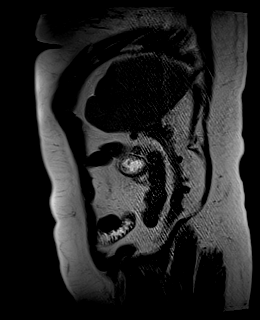
[im 47/47]
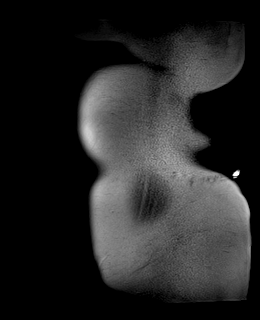

[Series 8: MRCP · sagittal · 40.0mm · 0.78mm/px · 1 of 5 slices shown (1 of 2)]
[im 1/5]
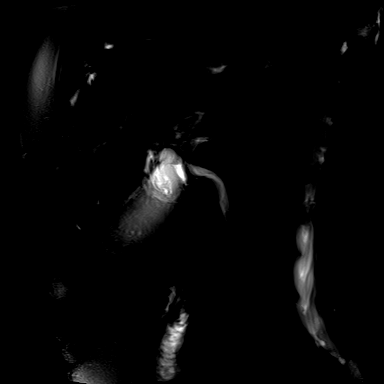

[Series 9: T2 · coronal · 5.0mm · 0.94mm/px · 1 of 10 slices shown (2 of 2)]
[im 1/10]
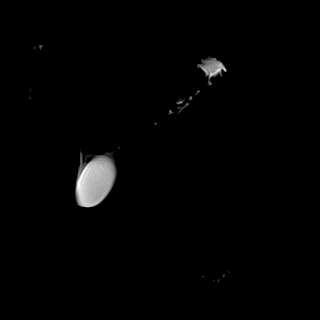

[Series 10: MRCP · sagittal · 40.0mm · 0.78mm/px · 1 of 5 slices shown (2 of 2)]
[im 1/5]
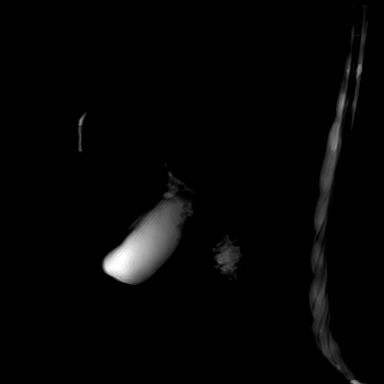

[28 of 48 positions shown; findings below may reference images not displayed]

DIAGNOSTIC STUDIES

EXAM

MR MRCP

INDICATION

dilated common bile duct. Gall stones
RUQ PAIN, NAUSEA, VOMITING, PRESENTED TO ER YESTERDAY, RG

TECHNIQUE

Multiplanar multisequence MRI, MRCP

COMPARISONS

None

FINDINGS

No focal hepatic lesion is identified.

Multiple small dependent gallstones. There is mild pericholecystic edema along the body of the
gallbladder. Mild distention of the gallbladder measuring approximately 9.5 x 3.5 cm.

There is a distal 5 millimeter choledocholith. Proximal common bile duct measures up to 0.8 cm.
There is not significant intrahepatic biliary ductal dilatation.

Spleen measures up to 13 cm in the AP oblique dimension.

Atrophic appearance of the pancreas.

Adrenals are unremarkable.

Normal appearance of the kidneys.

Abdominal aorta is normal in caliber.

Postoperative changes along the esophageal hiatus. The imaged bowel loops are not dilated.

No enlarged abdominal adenopathy.

IMPRESSION

Distal choledocholith with mild dilation of the common bile duct.

Cholelithiasis. Mild distention of the gallbladder and mild pericholecystic edema, which may
represent early/mild cholecystitis.

Tech Notes:

RUQ PAIN, NAUSEA, VOMITING, PRESENTED TO ER YESTERDAY, RG

## 2024-04-11 ENCOUNTER — Encounter
Admit: 2024-04-11 | Discharge: 2024-04-11 | Payer: MEDICAID | Primary: Student in an Organized Health Care Education/Training Program

## 2024-04-11 ENCOUNTER — Emergency Department: Admit: 2024-04-11 | Discharge: 2024-04-11 | Payer: MEDICAID

## 2024-04-12 ENCOUNTER — Observation Stay
Admit: 2024-04-12 | Discharge: 2024-04-15 | Payer: MEDICAID | Primary: Student in an Organized Health Care Education/Training Program

## 2024-04-12 ENCOUNTER — Inpatient Hospital Stay
Admit: 2024-04-12 | Discharge: 2024-04-12 | Payer: MEDICAID | Primary: Student in an Organized Health Care Education/Training Program

## 2024-04-13 ENCOUNTER — Ambulatory Visit
Admit: 2024-04-13 | Discharge: 2024-04-14 | Payer: MEDICAID | Primary: Student in an Organized Health Care Education/Training Program

## 2024-04-13 ENCOUNTER — Encounter
Admit: 2024-04-13 | Discharge: 2024-04-13 | Payer: MEDICAID | Primary: Student in an Organized Health Care Education/Training Program

## 2024-04-13 VITALS — BP 134/74 | HR 83 | Temp 98.10000°F | Resp 16

## 2024-04-13 DIAGNOSIS — R1084 Generalized abdominal pain: Principal | ICD-10-CM

## 2024-04-13 MED ORDER — BUPIVACAINE (PF) 0.5 % (5 MG/ML) IJ SOLN
20 mL | Freq: Once | INTRAMUSCULAR | 0 refills | Status: CP
Start: 2024-04-13 — End: ?
  Administered 2024-04-13: 22:00:00 20 mL via INTRAMUSCULAR

## 2024-04-13 MED ORDER — TRIAMCINOLONE ACETONIDE 40 MG/ML IJ SUSP
80 mg | Freq: Once | EPIDURAL | 0 refills | Status: CP
Start: 2024-04-13 — End: ?
  Administered 2024-04-13: 22:00:00 80 mg via EPIDURAL

## 2024-04-13 NOTE — Procedures
 Attending Surgeon: Rocco MARLA Alamin, MD    Anesthesia: Local      [place] AMB NERVE BLOCK PROC  Nerve: Abdominal Cutaneous (TAP)bilateral  Laterality: bilateral   on 04/13/2024 3:00 PM    Consent:   Consent obtained: written  Consent given by: patient  Alternatives discussed: alternative treatment, delayed treatment and no treatment  Discussed with patient the purpose of the treatment/procedure, other ways of treating my condition, including no treatment/ procedure and the risks and benefits of the alternatives. Patient has decided to proceed with treatment/procedure.        Universal Protocol:  Relevant documents: relevant documents present and verified  Test results: test results available and properly labeled  Imaging studies: imaging studies available  Required items: required blood products, implants, devices, and special equipment available  Site marked: the operative site was marked  Patient identity confirmed: Patient identify confirmed verbally with patient.        Time out: Immediately prior to procedure a time out was called to verify the correct patient, procedure, equipment, support staff and site/side marked as required        Procedures Details:   Indications: Pain Relief  Preparation: Patient was prepped and draped in the usual sterile fashion.  Prep: 2% chlorhexidine  Patient position: supine  Needle size: 25 G  Guidance: ultrasound  Justification for use of ultrasound guidance: The use of direct sonographic visualization of the needle (rather than a non-guided injection) was required to ensure accurate injection placement for diagnostic specificity, to maximize clinical efficacy and for safety purposes to minimize risk of bleeding or injury to nearby neurovascular structures.    Patient tolerance: tolerated well, no immediate complications      Estimated blood loss: none or minimal  Specimens: none  Patient tolerated the procedure well with no immediate complications. Pressure was applied, and hemostasis was accomplished.  Administrations This Visit       bupivacaine  PF (MARCAINE ) 0.5 % injection 20 mL       Admin Date  04/13/2024 Action  Given Dose  20 mL Route  Injection Documented By  Adline Perkins, RN              triamcinolone  acetonide (KENALOG -40) injection 80 mg       Admin Date  04/13/2024 Action  Given Dose  80 mg Route  Epidural Documented By  Adline Perkins, RN

## 2024-04-13 NOTE — Patient Instructions
 Procedure Completed Today: Trasversus Abdominus Plane (TAP) Block    Important information following your procedure today: You may drive today    Pain relief may not be immediate. It is possible you may even experience an increase in pain during the first 24-48 hours followed by a gradual decrease of your pain.  Though the procedure is generally safe and complications are rare, we do ask that you be aware of any of the following:   Any swelling, persistent redness, new bleeding, or drainage from the site of the injection.  You should not experience a severe headache.  You should not run a fever over 101? F.  New onset of sharp, severe back & or neck pain.  New onset of upper or lower extremity numbness or weakness.  New difficulty controlling bowel or bladder function after the injection.  New shortness of breath.    If any of these occur, please call to report this occurrence to Dr. Rosey at [phone]. If you are calling after 4:00 p.m., on weekends or holidays please call [phone] and ask to have the resident physician on call for the physician paged or go to your local emergency room.  You may experience soreness at the injection site. Ice can be applied at 20 minute intervals. Avoid application of direct heat, hot showers or hot tubs today.  Avoid strenuous activity today. You may resume your regular activities and exercise tomorrow.  Patients with diabetes may see an elevation in blood sugars for 7-10 days after the injection. It is important to pay close attention to your diet, check your blood sugars daily and report extreme elevations to the physician that treats your diabetes.  Patients taking a daily blood thinner can resume their regular dose this evening.  It is important that you take all medications ordered by your pain physician. Taking medication as ordered is an important part of your pain care plan. If you cannot continue the medication plan, please notify the physician.     Possible side effects to steroids that may occur:  Flushing or redness of the face  Irritability  Fluid retention  Change in women?s menses    The following medications were used: Bupivicaine   and Triamcinolone 

## 2024-04-13 NOTE — Progress Notes
 [facility]  INTERVENTIONAL PAIN PROCEDURE HISTORY AND PHYSICAL    Chief Complaint: Pain    HISTORY OF PRESENT ILLNESS:  Erin Sanchez is a 63 y.o. female with past medical history as documented below. 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    Past Medical History:    Anxiety disorder    Arthritis    Chronic pain    Chronic sinusitis    Cirrhosis (CMS-HCC)    COVID-19 long hauler    DDD (degenerative disc disease), lumbar    Depression    Diabetes (CMS-HCC)    Esophageal varices (CMS-HCC)    Esophagitis    GERD (gastroesophageal reflux disease)    Hepatitis C    Hepatitis C    History of hypertension    Hypertension    Iron  deficiency anemia    Kidney stone    Left eye injury    Liver disease    Lumbar radiculopathy    Nausea    Paraesophageal hernia    Peptic ulcer    Somatic dysfunction of thoracic region       Surgical History:   Procedure Laterality Date    HERNIA REPAIR  1999    HX ENDOSCOPY  2012    COLONOSCOPY  2012    Colonoscopy N/A 04/02/2020    Performed by Tobie Valdemar NOVAK, MD at [facility] OR    ESOPHAGOGASTRODUODENOSCOPY WITH BIOPSY - FLEXIBLE N/A 04/02/2020    Performed by Tobie Valdemar NOVAK, MD at [facility] OR    LAPAROSCOPIC REPAIR PARAESOPHAGEAL HERNIA WITH FUNDOPLASTY AND IMPLANTATION OF MESH N/A 01/22/2021    Performed by Jacqulyn Delon BIRCH, MD at [facility] OR    ESOPHAGOGASTRODUODENOSCOPY WITH ENDOSCOPIC ULTRASOUND EXAMINATION - FLEXIBLE N/A 02/19/2022    Performed by Noralyn Blossom, MD at [facility] ENDO    ENDOSCOPIC RETROGRADE CHOLANGIOPANCREATOGRAPHY WITH REMOVAL CALCULI/ DEBRIS FROM BILIARY/ PANCREATIC DUCT N/A 02/19/2022    Performed by Noralyn Blossom, MD at [facility] ENDO    LAPAROSCOPIC CHOLECYSTECTOMY, POSSIBLE OPEN N/A 02/20/2022    Performed by Court Lucinda BIRCH, MD at [facility] OR    HX SHOULDER REPLACEMENT      right    KNEE REPLACEMENT      left       family history includes Cancer in her mother; Heart Attack in her mother; Other in her father; Stroke in her father.    Social History     Socioeconomic History    Marital status: Widowed   Occupational History     Employer: [employer]   Tobacco Use    Smoking status: Former     Current packs/day: 0.00     Average packs/day: 0.3 packs/day for 15.0 years (3.8 ttl pk-yrs)     Types: Cigarettes     Start date: 03/10/1983     Quit date: 03/09/1998     Years since quitting: 26.1    Smokeless tobacco: Never   Vaping Use    Vaping status: Never Used   Substance and Sexual Activity    Alcohol use: Not Currently    Drug use: No    Sexual activity: Not Currently     Birth control/protection: None  Allergies[1]    Vitals:    04/13/24 1613   BP Source: Arm, Right Upper   Pulse: 83   Temp: 36.7 ?C (98.1 ?F)   Resp: 16   SpO2: 95%   TempSrc: Oral   PainSc: Six     Pain Score: Six       REVIEW OF SYSTEMS: 10 point ROS obtained and negative except as above in HPI.       PHYSICAL EXAM  General: Alert, cooperative, no acute distress.  HEENT: Normocephalic, atraumatic.  Lungs: Unlabored respirations, bilateral and equal chest excursion.  Heart: Regular rate.  Skin: Warm and dry to touch.  Abdomen: Nondistended.  MSK: No deformity.  Neurological: Alert and oriented x3.     Oswestry:          IMPRESSION:    1. Generalized abdominal pain           PLAN: bilateral TAP block              [1]   Allergies  Allergen Reactions    Codeine ITCHING    Ciprofloxacin UNKNOWN     Used for an eye infection, caused patient to be very ill.

## 2024-04-14 ENCOUNTER — Observation Stay
Admit: 2024-04-14 | Discharge: 2024-04-14 | Payer: MEDICAID | Primary: Student in an Organized Health Care Education/Training Program

## 2024-04-15 ENCOUNTER — Observation Stay
Admit: 2024-04-15 | Discharge: 2024-04-16 | Payer: MEDICAID | Primary: Student in an Organized Health Care Education/Training Program

## 2024-04-15 ENCOUNTER — Encounter
Admit: 2024-04-15 | Discharge: 2024-04-15 | Payer: MEDICAID | Primary: Student in an Organized Health Care Education/Training Program

## 2024-04-17 ENCOUNTER — Encounter
Admit: 2024-04-17 | Discharge: 2024-04-17 | Payer: MEDICAID | Primary: Student in an Organized Health Care Education/Training Program

## 2024-04-17 DIAGNOSIS — K7460 Unspecified cirrhosis of liver: Principal | ICD-10-CM

## 2024-04-18 ENCOUNTER — Encounter
Admit: 2024-04-18 | Discharge: 2024-04-18 | Payer: MEDICAID | Primary: Student in an Organized Health Care Education/Training Program

## 2024-04-18 NOTE — Telephone Encounter [36]
 Referral received via internal. Docs in 02.  Past [facility] patient--goes to [facility]

## 2024-04-18 NOTE — Telephone Encounter [36]
 Hepatology Referral Summary    Erin Sanchez, Jul 17, 1960, [id number]                     Reason for Visit/Diagnosis:  Cirrhosis    MELD  7  03/2024    HPI Summary (PMH and MELD to be included only if OLT):  63yo last seen in the [facility] by Dr. Chandra in 2021 for same diagnosis      Name: Erin Sanchez  Medical Record Number: [id number]        Account Number:  [id number]  Date Of Birth:  04/15/1961                         Age:  63 y.o.  Admit date:  04/11/2024                     Discharge date: 04/15/2024        Discharge Attending:  Leontine Specking  Discharge Summary Completed By: Leontine JULIANNA Specking, DO     Service: Med Private T [phone]     Reason for hospitalization:  Intractable abdominal pain [R10.9]     Primary Discharge Diagnosis:   Intractable abdominal pain     Hospital Diagnoses:  Hospital Problems        Active Problems    Hepatitis C    Liver cirrhosis (CMS-HCC)    Type 2 diabetes mellitus, without long-term current use of insulin    (CMS-HCC)    Chronic gastroesophageal reflux disease       Resolved Problems    * (Principal) RESOLVED: Intractable abdominal pain      Present on Admission:   (Resolved) Intractable abdominal pain   Chronic gastroesophageal reflux disease   Hepatitis C   Liver cirrhosis (CMS-HCC)   Type 2 diabetes mellitus, without long-term current use of insulin  (CMS-HCC)           Significant Past Medical History     Past Medical History        Anxiety disorder  Arthritis  Chronic pain  Chronic sinusitis  Cirrhosis (CMS-HCC)  COVID-19 long hauler  DDD (degenerative disc disease), lumbar  Depression  Diabetes (CMS-HCC)  Esophageal varices (CMS-HCC)  Esophagitis  GERD (gastroesophageal reflux disease)  Hepatitis C      Comment:  per labs on 02/15/2020 Hep C negative.   treated  Hepatitis C  History of hypertension  Hypertension  Iron  deficiency anemia  Kidney stone  Left eye injury      Comment:  decreased blood flow due to Covid  Liver disease      Comment:  HEP C -SPLEEN  Lumbar radiculopathy  Nausea Comment:  chronic  Paraesophageal hernia  Peptic ulcer  Somatic dysfunction of thoracic region        Allergies   Codeine and Ciprofloxacin     Brief Hospital Course   The patient was admitted and the following issues were addressed during this hospitalization: (with pertinent details including admission exam/imaging/labs).       Erin Sanchez is a 63 y.o. 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 Some of her pain is consistent with Anterior Cutaneous Nerve Entrapment Syndrome.      Abdominal Pain - LLQ and RUQ - improving  Nausea - improved  Anterior Cutaneous Nerve Entrapment Syndrome  -Has a 1 year history of pain with worsening over the last month and then further worsening two days prior to presentation  -Associated with nausea and dry heaving (cannot vomit with [employer])  -Able to point with one finger to location of pain in LLQ  -Positive Carnett's sign on exam and tenderness to light touch of the abdominal wall in LLQ  -CT abd/pelvis with telescoping of the proximal stomach into the patulous lower esophagus which may be related to history of fundoplasty. No bowel obstruction or inflammatory mass.  -UDS positive for [facility]  -Bariatric surgery team evaluated - pain is not secondary to prior surgical intervention  -Overall, LLQ pain most consistent with anterior cutaneous nerve entrapment. RUQ pain possible nerve entrapment vs post-cholecystectomy vs other etiology  -cont MMPC with tylenol , gabapentin, robaxin , venlafaxine , lidocaine  patch, and amitriptyline/gabapentin/baclofen gel. PO oxycodone  5 mg q6h prn # 10 tabs on dc  -TAP block performed 10/23 with improvement in pain  -Gastroenterology consulted               -trial of cholestyramine 2g, monitor for constipation. F/u with PCP for ongoing management. If no improvement in symptoms/diarrhea could dc.                            -abdominal binder (ok per surgery)  -tolerating diet with no n/v prior to discharge     Day of discharge exam notable for:   Vitals reviewed.   Constitutional: awake, A&O, NAD  Cardiovascular: rrr, no murmur  Respiratory: CTAB, non-labored breathing  GI: abdomen soft, nontender, BS+  MSK: extremities well perfused, no edema     Labs:  In O2  CBC, CMP/Hep Panel, INR, Viral Hepatitis, and Autoimmune    Radiology/Facility:  In O2  CT    Pathology/Facility:  In O2  EGD    Endoscopy/Facility:  In O2  EGD/Colon/EUS/ ERCP    Appointment Needs --   - Provider: Referred directly to: [facility]   - Urgency: Next Available   - Department: Gen Hep   - Other: None    Insurance: Payor: [employer]HC MEDICAID [place] / Plan: [employer] COMMUNITY PLAN [place] / Product Type: Medicaid /     Provider Info --  Referring:   Toby Alfonso CROME, MD  [address]    [place],  [place] [id number]    PCP:   Veria Pac  [address]  [place] [id number]
